# Patient Record
Sex: Female | Born: 1970 | Race: White | Hispanic: No | Marital: Married | Smoking: Never smoker
Health system: Southern US, Community
[De-identification: ages and names within clinical notes are randomized; demographics above are authoritative.]

## PROBLEM LIST (undated history)

## (undated) DIAGNOSIS — G5600 Carpal tunnel syndrome, unspecified upper limb: Secondary | ICD-10-CM

## (undated) DIAGNOSIS — T7840XA Allergy, unspecified, initial encounter: Secondary | ICD-10-CM

## (undated) DIAGNOSIS — I10 Essential (primary) hypertension: Secondary | ICD-10-CM

## (undated) DIAGNOSIS — E079 Disorder of thyroid, unspecified: Secondary | ICD-10-CM

## (undated) DIAGNOSIS — M542 Cervicalgia: Secondary | ICD-10-CM

## (undated) DIAGNOSIS — M1990 Unspecified osteoarthritis, unspecified site: Secondary | ICD-10-CM

## (undated) DIAGNOSIS — J45909 Unspecified asthma, uncomplicated: Secondary | ICD-10-CM

## (undated) HISTORY — DX: Disorder of thyroid, unspecified: E07.9

## (undated) HISTORY — DX: Carpal tunnel syndrome, unspecified upper limb: G56.00

## (undated) HISTORY — DX: Cervicalgia: M54.2

## (undated) HISTORY — DX: Allergy, unspecified, initial encounter: T78.40XA

## (undated) HISTORY — DX: Unspecified asthma, uncomplicated: J45.909

---

## 1999-02-10 ENCOUNTER — Other Ambulatory Visit: Admission: RE | Admit: 1999-02-10 | Discharge: 1999-02-10 | Payer: Self-pay | Admitting: Obstetrics and Gynecology

## 2000-02-26 ENCOUNTER — Other Ambulatory Visit: Admission: RE | Admit: 2000-02-26 | Discharge: 2000-02-26 | Payer: Self-pay | Admitting: Obstetrics and Gynecology

## 2001-02-25 ENCOUNTER — Other Ambulatory Visit: Admission: RE | Admit: 2001-02-25 | Discharge: 2001-02-25 | Payer: Self-pay | Admitting: *Deleted

## 2002-05-23 ENCOUNTER — Other Ambulatory Visit: Admission: RE | Admit: 2002-05-23 | Discharge: 2002-05-23 | Payer: Self-pay | Admitting: Obstetrics and Gynecology

## 2003-09-20 ENCOUNTER — Other Ambulatory Visit: Admission: RE | Admit: 2003-09-20 | Discharge: 2003-09-20 | Payer: Self-pay | Admitting: Obstetrics and Gynecology

## 2004-09-18 ENCOUNTER — Encounter: Admission: RE | Admit: 2004-09-18 | Discharge: 2004-09-18 | Payer: Self-pay | Admitting: Family Medicine

## 2006-02-22 ENCOUNTER — Encounter: Admission: RE | Admit: 2006-02-22 | Discharge: 2006-02-22 | Payer: Self-pay | Admitting: Obstetrics & Gynecology

## 2006-04-26 ENCOUNTER — Inpatient Hospital Stay (HOSPITAL_COMMUNITY): Admission: AD | Admit: 2006-04-26 | Discharge: 2006-04-30 | Payer: Self-pay | Admitting: Obstetrics & Gynecology

## 2006-04-27 ENCOUNTER — Encounter (INDEPENDENT_AMBULATORY_CARE_PROVIDER_SITE_OTHER): Payer: Self-pay | Admitting: *Deleted

## 2010-05-13 ENCOUNTER — Encounter: Admission: RE | Admit: 2010-05-13 | Discharge: 2010-05-13 | Payer: Self-pay | Admitting: Unknown Physician Specialty

## 2010-07-18 ENCOUNTER — Encounter: Admission: RE | Admit: 2010-07-18 | Discharge: 2010-07-18 | Payer: Self-pay | Admitting: Unknown Physician Specialty

## 2011-01-11 ENCOUNTER — Encounter: Payer: Self-pay | Admitting: Family Medicine

## 2011-02-13 ENCOUNTER — Other Ambulatory Visit: Payer: Self-pay | Admitting: Obstetrics & Gynecology

## 2011-03-18 ENCOUNTER — Other Ambulatory Visit: Payer: Self-pay | Admitting: Obstetrics & Gynecology

## 2011-05-08 NOTE — Op Note (Signed)
Renee Fernandez, Renee Fernandez           ACCOUNT NO.:  [id number]   MEDICAL RECORD NO.:  [id number]          PATIENT TYPE:  INP   LOCATION:  9165                          FACILITY:  [facility]   PHYSICIAN:  Genia Del, M.D.DATE OF BIRTH:  11/25/1971   DATE OF PROCEDURE:  DATE OF DISCHARGE:                                 OPERATIVE REPORT   OPERATIVE PROTOCOL:  40+ weeks gestation, failure to progress, nonreassuring  fetal heart rate monitoring.   POSTOPERATIVE DIAGNOSIS:  40+ weeks gestation, failure to progress,  nonreassuring fetal heart rate monitoring.   PROCEDURE:  Urgent low transverse primary C-section.   SURGEON:  Genia Del, M.D.   ASSISTANT:  Marlinda Mike, C.N.M.   ANESTHESIOLOGIST:  Raul Del, M.D.   PROCEDURE:  Under epidural anesthesia, the patient is in 15 degrees left  decubitus position.  She is prepped with Betadine in the abdominal,  suprapubic and vulvar areas.  The bladder catheter is already in place, and  the patient is draped as usual.  An infiltration of Marcaine, 0.25 plain, 10  cc is done at the level where the Pfannenstiel will be.  We then make a  Pfannenstiel incision with the scalpel.  We open the adipose tissue with the  scalpel and the electrocautery.  The aponeurosis is opened transversely with  the electrocautery and the Mayo scissors.  We then separate the aponeurosis  from the recti muscles superiorly and inferiorly.  We open the parietal  peritoneum bluntly with a finger and stretch.  We then put the bladder  retractor, open the visceral peritoneum transversely with the Metzenbaum  scissors over the lower uterine segment.  The bladder is withdrawn  downwards.  The bladder retractor is repositioned.  We make a low transverse  hysterotomy with the scalpel, extension on each side with dressing scissors.  Amniotic fluid is clear.  Fetus is in cephalic position.  A loose nuchal  cord is removed.  The baby is suctioned, after delivery of  the head.  We  then finish delivery.  We clamp the cord and cut it.  The baby is given to  the neonatal team.  Apgars are 8 and 9.  The pH is done and is has come back  7.27.  The cord blood is taken.  The placenta is delivered spontaneously.  We give the placenta for cord blood banking, and then the placenta will go  to pathology.  We do a uterine revision.  Pitocin IV is started.  Ancef 1  gram IV is given.  The uterus contracts well.  Both ovaries and both tubes  are normal to inspection.  We close the hysterotomy in a first locked  running suture of Vicryl 0, a second layer in a mattress fashion with Vicryl  0 is done, and an X-stitch for hemostasis is done in the midline.  We then  irrigate and suction the abdominopelvic cavity.  Hemostasis is completed on  the recti muscles and adipose tissue with the electrocautery.  The  aponeurosis is closed in two half-running suture of Vicryl 0.  We  reapproximate the skin with staples and a dry  dressing is applied.  The  count of instruments and sponges was complete times two.  The estimated  blood loss was 700 cc.  No complications occurred and the patient was  brought to recovery room in good status.  Note that no latex was used for  the procedure given the allergies.      Genia Del, M.D.  Electronically Signed     ML/MEDQ  D:  04/27/2006  T:  04/27/2006  Job:  [id number]

## 2011-05-08 NOTE — Discharge Summary (Signed)
Renee Fernandez, Renee Fernandez           ACCOUNT NO.:  [id number]   MEDICAL RECORD NO.:  [id number]          PATIENT TYPE:  INP   LOCATION:  9123                          FACILITY:  [facility]   PHYSICIAN:  Genia Del, M.D.DATE OF BIRTH:  Aug 19, 1971   DATE OF ADMISSION:  04/26/2006  DATE OF DISCHARGE:  04/30/2006                                 DISCHARGE SUMMARY   ADMISSION DIAGNOSIS:  Forty plus weeks, chronic hypertension, mild pregnancy  induced hypertension for induction.   DISCHARGE DIAGNOSES:  1.  Forty plus weeks, chronic hypertension, mild pregnancy induced      hypertension for induction.  2.  Failure to progress.  3.  Non-reassuring fetal heart rate monitoring.   INTERVENTION:  Low-transverse cesarean section, primary C-section on Apr 27, 2006.  Birth of a baby boy.   HOSPITAL COURSE:  The patient was induced for mild PIH.  She progressed to  3+ cm and did not progress further in spite of good uterine contractions.  Fetal heart rate monitoring showed repetitive moderate variable  decelerations.  The decision was therefore, taken to proceed with urgent  primary C-section.  The procedure was uneventful.  Estimated blood loss was  700 cc.  No complication occurred.   Her post-op evaluation was normal.  She remained hemodynamically stable and  afebrile.   Her post-op hemoglobin was 9.7, hematocrit 29.3 on Apr 28, 2006.  She was  discharged in stable status on Apr 30, 2006.  Her blood pressures were  stable on hydrochlorothiazide which was continued at home.   Post-op advice were given.  She will follow up in 4 weeks at [facility]  OB/GYN.      Genia Del, M.D.  Electronically Signed     ML/MEDQ  D:  05/28/2006  T:  05/28/2006  Job:  [id number]

## 2011-07-13 ENCOUNTER — Other Ambulatory Visit: Payer: Self-pay | Admitting: Family Medicine

## 2011-07-13 DIAGNOSIS — Z1231 Encounter for screening mammogram for malignant neoplasm of breast: Secondary | ICD-10-CM

## 2011-07-22 ENCOUNTER — Other Ambulatory Visit: Payer: Self-pay | Admitting: Family Medicine

## 2011-07-22 ENCOUNTER — Ambulatory Visit
Admission: RE | Admit: 2011-07-22 | Discharge: 2011-07-22 | Disposition: A | Discharge status: Home / Self Care | Payer: BC Managed Care – PPO | Source: Ambulatory Visit | Attending: Family Medicine | Admitting: Family Medicine

## 2011-07-22 DIAGNOSIS — Z1231 Encounter for screening mammogram for malignant neoplasm of breast: Secondary | ICD-10-CM

## 2012-08-03 ENCOUNTER — Other Ambulatory Visit: Payer: Self-pay | Admitting: Obstetrics & Gynecology

## 2012-08-03 DIAGNOSIS — Z1231 Encounter for screening mammogram for malignant neoplasm of breast: Secondary | ICD-10-CM

## 2012-08-24 ENCOUNTER — Ambulatory Visit
Admission: RE | Admit: 2012-08-24 | Discharge: 2012-08-24 | Disposition: A | Discharge status: Home / Self Care | Payer: BC Managed Care – PPO | Source: Ambulatory Visit | Attending: Obstetrics & Gynecology | Admitting: Obstetrics & Gynecology

## 2012-08-24 DIAGNOSIS — Z1231 Encounter for screening mammogram for malignant neoplasm of breast: Secondary | ICD-10-CM

## 2013-08-17 ENCOUNTER — Other Ambulatory Visit: Payer: Self-pay

## 2013-08-17 DIAGNOSIS — Z1231 Encounter for screening mammogram for malignant neoplasm of breast: Secondary | ICD-10-CM

## 2013-09-06 ENCOUNTER — Ambulatory Visit
Admission: RE | Admit: 2013-09-06 | Discharge: 2013-09-06 | Disposition: A | Discharge status: Home / Self Care | Payer: BC Managed Care – PPO | Source: Ambulatory Visit

## 2013-09-06 DIAGNOSIS — Z1231 Encounter for screening mammogram for malignant neoplasm of breast: Secondary | ICD-10-CM

## 2013-09-26 ENCOUNTER — Encounter: Payer: Self-pay | Admitting: Emergency Medicine

## 2013-09-26 ENCOUNTER — Emergency Department
Admission: EM | Admit: 2013-09-26 | Discharge: 2013-09-26 | Disposition: A | Discharge status: Home / Self Care | Payer: BC Managed Care – PPO | Source: Home / Self Care | Attending: Family Medicine | Admitting: Family Medicine

## 2013-09-26 DIAGNOSIS — J069 Acute upper respiratory infection, unspecified: Secondary | ICD-10-CM

## 2013-09-26 DIAGNOSIS — J45901 Unspecified asthma with (acute) exacerbation: Secondary | ICD-10-CM

## 2013-09-26 HISTORY — DX: Essential (primary) hypertension: I10

## 2013-09-26 HISTORY — DX: Unspecified osteoarthritis, unspecified site: M19.90

## 2013-09-26 MED ORDER — BENZONATATE 200 MG PO CAPS: ORAL_CAPSULE | ORAL | Status: DC

## 2013-09-26 MED ORDER — PREDNISONE 20 MG PO TABS: 20.0000 mg | ORAL_TABLET | Freq: Two times a day (BID) | ORAL | Status: DC

## 2013-09-26 MED ORDER — METHYLPREDNISOLONE ACETATE 80 MG/ML IJ SUSP
80.0000 mg | Freq: Once | INTRAMUSCULAR | Status: AC
  Administered 2013-09-26: 80 mg via INTRAMUSCULAR

## 2013-09-26 MED ORDER — ALBUTEROL SULFATE HFA 108 (90 BASE) MCG/ACT IN AERS: 2.0000 | INHALATION_SPRAY | RESPIRATORY_TRACT | Status: DC | PRN

## 2013-09-26 MED ORDER — AMOXICILLIN 875 MG PO TABS: 875.0000 mg | ORAL_TABLET | Freq: Two times a day (BID) | ORAL | Status: DC

## 2013-09-26 NOTE — ED Provider Notes (Signed)
CSN: [id number]     Arrival date & time 09/26/13  1702 History   None    Chief Complaint  Patient presents with  . Cough     HPI Comments: Patient complains of two day history of URI symptoms with productive cough, increased sinus congestion, low grade fever, tightness in anterior chest, occasional wheezing/shortness of breath, and myalgias.  She has also had some loose stools. She has a history of asthma. She has had pneumonia in the past.  The history is provided by the patient.    Past Medical History  Diagnosis Date  . Hypertension   . Arthritis    Past Surgical History  Procedure Laterality Date  . Cesarean section     Family History  Problem Relation Age of Onset  . Multiple sclerosis Mother   . Hypertension Father    History  Substance Use Topics  . Smoking status: Never Smoker   . Smokeless tobacco: Not on file  . Alcohol Use: Yes   OB History   Grav Para Term Preterm Abortions TAB SAB Ect Mult Living                 Review of Systems No sore throat + cough No pleuritic pain + wheezing + nasal congestion + post-nasal drainage No sinus pain/pressure No itchy/red eyes ? earache No hemoptysis + SOB + low grade fever, + chills + nausea No vomiting No abdominal pain No diarrhea No urinary symptoms No skin rashes + fatigue + myalgias + headache Used OTC meds without relief   Allergies  Review of patient's allergies indicates no known allergies.  Home Medications   Current Outpatient Rx  Name  Route  Sig  Dispense  Refill  . benazepril-hydrochlorthiazide (LOTENSIN HCT) 10-12.5 MG per tablet   Oral   Take 1 tablet by mouth daily.         . cetirizine (ZYRTEC) 10 MG tablet   Oral   Take 10 mg by mouth daily.         Marland Kitchen levothyroxine (SYNTHROID, LEVOTHROID) 50 MCG tablet   Oral   Take 50 mcg by mouth daily before breakfast.         . pindolol (VISKEN) 10 MG tablet   Oral   Take 10 mg by mouth 2 (two) times daily.         Marland Kitchen  albuterol (PROVENTIL HFA;VENTOLIN HFA) 108 (90 BASE) MCG/ACT inhaler   Inhalation   Inhale 2 puffs into the lungs every 4 (four) hours as needed for wheezing.   1 Inhaler   0   . amoxicillin (AMOXIL) 875 MG tablet   Oral   Take 1 tablet (875 mg total) by mouth 2 (two) times daily. (Rx void after 10/04/13)   20 tablet   0   . benzonatate (TESSALON) 200 MG capsule      Take one cap at bedtime as necessary for cough   12 capsule   0   . predniSONE (DELTASONE) 20 MG tablet   Oral   Take 1 tablet (20 mg total) by mouth 2 (two) times daily.   10 tablet   0    BP 137/70  Pulse 103  Temp(Src) 99.8 F (37.7 C) (Oral)  Ht 5' (1.524 m)  Wt 220 lb (99.791 kg)  BMI 42.97 kg/m2  SpO2 94% Physical Exam Nursing notes and Vital Signs reviewed. Appearance:  Patient appears stated age, and in no acute distress.  Patient is obese (BMI 43.0) Eyes:  Pupils  are equal, round, and reactive to light and accomodation.  Extraocular movement is intact.  Conjunctivae are not inflamed  Ears:  Canals normal.  Tympanic membranes normal.  Nose:  Mildly congested turbinates.  No sinus tenderness.  Pharynx:  Normal Neck:  Supple.  Slightly tender shotty posterior nodes are palpated bilaterally  Lungs:  Clear to auscultation.  Breath sounds are equal.  Heart:  Regular rate and rhythm without murmurs, rubs, or gallops.  Abdomen:  Nontender without masses or hepatosplenomegaly.  Bowel sounds are present.  No CVA or flank tenderness.  Extremities:  No edema.  No calf tenderness Skin:  No rash present.    ED Course  Procedures  none        MDM   1. Acute upper respiratory infections of unspecified site; suspect viral URI   2. Asthma with acute exacerbation    There is no evidence of bacterial infection today.   DepoMedrol 80mg  IM; tomorrow begin prednisone burst. Prescription written for Benzonatate (Tessalon) to take at bedtime for night-time cough.  Refill albuterol inhaler. Take plain Mucinex  (guaifenesin) twice daily for cough and congestion.  May add Sudafed for sinus congestion.  Increase fluid intake, rest. May use Afrin nasal spray (or generic oxymetazoline) twice daily for about 5 days.  Also recommend using saline nasal spray several times daily and saline nasal irrigation (AYR is a common brand) Stop all antihistamines for now, and other non-prescription cough/cold preparations. Begin Amoxicillin if not improving about 5 days or if persistent fever develops (Given a prescription to hold, with an expiration date)  Follow-up with family doctor if not improving 7 to 10 days.     Lattie Haw, MD 09/27/13 1106

## 2013-09-26 NOTE — ED Notes (Signed)
Productive cough, fever, nausea, diarrhea, body aches x 2 days

## 2013-09-26 NOTE — Discharge Instructions (Signed)
Take plain Mucinex (guaifenesin) twice daily for cough and congestion.  May add Sudafed for sinus congestion.  Increase fluid intake, rest. May use Afrin nasal spray (or generic oxymetazoline) twice daily for about 5 days.  Also recommend using saline nasal spray several times daily and saline nasal irrigation (AYR is a common brand) Stop all antihistamines for now, and other non-prescription cough/cold preparations. Begin Amoxicillin if not improving about 5 days or if persistent fever develops. Follow-up with family doctor if not improving 7 to 10 days.

## 2014-03-16 ENCOUNTER — Ambulatory Visit (INDEPENDENT_AMBULATORY_CARE_PROVIDER_SITE_OTHER): Payer: BC Managed Care – PPO | Admitting: Family Medicine

## 2014-03-16 VITALS — BP 120/80 | HR 73 | Temp 98.9°F | Resp 16 | Ht 60.5 in | Wt 217.0 lb

## 2014-03-16 DIAGNOSIS — I10 Essential (primary) hypertension: Secondary | ICD-10-CM

## 2014-03-16 DIAGNOSIS — Z9109 Other allergy status, other than to drugs and biological substances: Secondary | ICD-10-CM

## 2014-03-16 DIAGNOSIS — E039 Hypothyroidism, unspecified: Secondary | ICD-10-CM

## 2014-03-16 DIAGNOSIS — J45909 Unspecified asthma, uncomplicated: Secondary | ICD-10-CM

## 2014-03-16 DIAGNOSIS — Z0000 Encounter for general adult medical examination without abnormal findings: Secondary | ICD-10-CM

## 2014-03-16 MED ORDER — LEVOTHYROXINE SODIUM 50 MCG PO TABS: 50.0000 ug | ORAL_TABLET | Freq: Every day | ORAL | Status: DC

## 2014-03-16 MED ORDER — ALBUTEROL SULFATE HFA 108 (90 BASE) MCG/ACT IN AERS: 2.0000 | INHALATION_SPRAY | RESPIRATORY_TRACT | Status: DC | PRN

## 2014-03-16 MED ORDER — PINDOLOL 10 MG PO TABS: 10.0000 mg | ORAL_TABLET | Freq: Two times a day (BID) | ORAL | Status: DC

## 2014-03-16 MED ORDER — BENAZEPRIL-HYDROCHLOROTHIAZIDE 10-12.5 MG PO TABS: 1.0000 | ORAL_TABLET | Freq: Every day | ORAL | Status: DC

## 2014-03-16 MED ORDER — CETIRIZINE HCL 10 MG PO TABS: 10.0000 mg | ORAL_TABLET | Freq: Every day | ORAL | Status: AC

## 2014-03-16 NOTE — Progress Notes (Signed)
Subjective:    Patient ID: Renee Fernandez, female    DOB: 1971-12-08, 43 y.o.   MRN: [id number]  HPI   Review of Systems     Objective:   Physical Exam   Patient ID: Renee Fernandez MRN: [id number], DOB: [birth year], 43 y.o. Date of Encounter: 03/16/2014, 8:59 AM  Primary Physician: Elvina Sidle, MD  Chief Complaint: Physical (CPE)  HPI: 43 y.o. y/o female with history of noted below here for CPE.   Scribed for Elvina Sidle MD, the patient was seen in room 13. This chart was scribed by Lewanda Rife, ED scribe. Patient's care was started at 8:53 AM  HPI Comments: Hx was provided by the pt.  Renee Fernandez is a 43 y.o. female who presents to the [facility] for a complete physical. Reports she needs refills Zyrtec, synthroid, HCTZ, and albuterol. States she does not exercise regularly. Reports PMHx of seasonal allergies. Denies smoking cigarettes.  Reports PMHx uterine ablation, hypothyroidism, HTN, asthma, and seasonal allergies.   States she works at [employer]. Pt describes an unusual experience with [facility] employee health phlebotomist who extracted blood using gravity and did not use a Vacutainer. States she brought in blood results from [facility].   LMP: 02/2011 uterine ablation  Pap: MMG: Review of Systems: Consitutional: No fever, chills, fatigue, night sweats, lymphadenopathy, or weight changes. Eyes: No visual changes, eye redness, or discharge. ENT/Mouth: Ears: No otalgia, tinnitus, hearing loss, discharge. Nose: No congestion, rhinorrhea, sinus pain, or epistaxis. Throat: No sore throat, post nasal drip, or teeth pain. Cardiovascular: No CP, palpitations, diaphoresis, DOE, edema, orthopnea, PND. Respiratory: No cough, hemoptysis, SOB, or wheezing. Gastrointestinal: No anorexia, dysphagia, reflux, pain, nausea, vomiting, hematemesis, diarrhea, constipation, BRBPR, or melena. Breast: No discharge, pain, swelling, or  mass. Genitourinary: No dysuria, frequency, urgency, hematuria, incontinence, nocturia, amenorrhea, vaginal discharge, pruritis, burning, abnormal bleeding, or pain. Musculoskeletal: No decreased ROM, myalgias, stiffness, joint swelling, or weakness. Skin: No rash, erythema, lesion changes, pain, warmth, jaundice, or pruritis. Neurological: No headache, dizziness, syncope, seizures, tremors, memory loss, coordination problems, or paresthesias. Psychological: No anxiety, depression, hallucinations, SI/HI. Endocrine: No fatigue, polydipsia, polyphagia, polyuria, or known diabetes. All other systems were reviewed and are otherwise negative.  Past Medical History  Diagnosis Date   Hypertension    Arthritis      Past Surgical History  Procedure Laterality Date   Cesarean section      Home Meds:  Prior to Admission medications   Medication Sig Start Date End Date Taking? Authorizing Provider  albuterol (PROVENTIL HFA;VENTOLIN HFA) 108 (90 BASE) MCG/ACT inhaler Inhale 2 puffs into the lungs every 4 (four) hours as needed for wheezing. 09/26/13  Yes Lattie Haw, MD  benazepril-hydrochlorthiazide (LOTENSIN HCT) 10-12.5 MG per tablet Take 1 tablet by mouth daily.   Yes Historical Provider, MD  cetirizine (ZYRTEC) 10 MG tablet Take 10 mg by mouth daily.   Yes Historical Provider, MD  levothyroxine (SYNTHROID, LEVOTHROID) 50 MCG tablet Take 50 mcg by mouth daily before breakfast.   Yes Historical Provider, MD  pindolol (VISKEN) 10 MG tablet Take 10 mg by mouth 2 (two) times daily.   Yes Historical Provider, MD    Allergies: No Known Allergies  History   Social History   Marital Status: Married    Spouse Name: N/A    Number of Children: N/A   Years of Education: N/A   Occupational History   Not on file.   Social History Main Topics  Smoking status: Never Smoker    Smokeless tobacco: Not on file   Alcohol Use: Yes   Drug Use: Not on file   Sexual Activity: Not on file    Other Topics Concern   Not on file   Social History Narrative   No narrative on file    Family History  Problem Relation Age of Onset   Multiple sclerosis Mother    Hypertension Father     Physical Exam:* Blood pressure 120/80, pulse 73, temperature 98.9 F (37.2 C), temperature source Oral, resp. rate 16, height 5' 0.5" (1.537 m), weight 217 lb (98.431 kg), SpO2 98.00%., Body mass index is 41.67 kg/(m^2). General: Well developed, well nourished, in no acute distress. HEENT: Normocephalic, atraumatic. Conjunctiva pink, sclera non-icteric. Pupils 2 mm constricting to 1 mm, round, regular, and equally reactive to light and accomodation. EOMI. Internal auditory canal clear. TMs with good cone of light and without pathology. Nasal mucosa pink. Nares are without discharge. No sinus tenderness. Oral mucosa pink. Dentition normal. Pharynx without exudate.   Neck: Supple. Trachea midline. No thyromegaly. Full ROM. No lymphadenopathy. Lungs: Clear to auscultation bilaterally without wheezes, rales, or rhonchi. Breathing is of normal effort and unlabored. Cardiovascular: RRR with S1 S2. No murmurs, rubs, or gallops appreciated. Distal pulses 2+ symmetrically. No carotid or abdominal bruits. Abdomen: Soft, non-tender, non-distended with normoactive bowel sounds. No hepatosplenomegaly or masses. No rebound/guarding. No CVA tenderness. Without hernias.  Musculoskeletal: Full range of motion and 5/5 strength throughout. Without swelling, atrophy, tenderness, crepitus, or warmth. Extremities without clubbing, cyanosis, or edema. Calves supple. Skin: Warm and moist without erythema, ecchymosis, wounds, or rash. Neuro: A+Ox3. CN II-XII grossly intact. Moves all extremities spontaneously. Full sensation throughout. Normal gait. DTR 2+ throughout upper and lower extremities. Finger to nose intact. Psych:  Responds to questions appropriately with a normal affect.   Studies: CBC, CMET, Lipid, TSH,  resulted. Patient is  UA:   Assessment/Plan:  43 y.o. y/o female here for CPE -Hypertension - Plan: pindolol (VISKEN) 10 MG tablet, benazepril-hydrochlorthiazide (LOTENSIN HCT) 10-12.5 MG per tablet  Hypothyroidism - Plan: levothyroxine (SYNTHROID, LEVOTHROID) 50 MCG tablet  Environmental allergies - Plan: cetirizine (ZYRTEC) 10 MG tablet  Asthma - Plan: albuterol (PROVENTIL HFA;VENTOLIN HFA) 108 (90 BASE) MCG/ACT inhaler  Routine general medical examination at a health care facility    Signed, Elvina SidleKurt Lauenstein, MD 03/16/2014 8:59 AM         Assessment & Plan:

## 2014-03-16 NOTE — Patient Instructions (Signed)

## 2014-03-19 ENCOUNTER — Telehealth: Payer: Self-pay

## 2014-03-19 NOTE — Telephone Encounter (Signed)
Lm for rtn call 

## 2014-03-19 NOTE — Telephone Encounter (Signed)
Pt requesting prednisone pak for asthma  Best phone [phone]  Pharmacy [facility]

## 2014-03-20 NOTE — Telephone Encounter (Signed)
Dr. Elbert EwingsL do you want to send in a dose pack.

## 2014-03-20 NOTE — Telephone Encounter (Signed)
Pt has asthma and has developed a cough and has been dealing with allergies all weekend regardless of her inhaler and Zyrtec. Pt states the only thing that works is a prednisone dose pak. Please advise if we can call in this for her.

## 2014-07-31 ENCOUNTER — Other Ambulatory Visit: Payer: Self-pay

## 2014-07-31 DIAGNOSIS — Z1231 Encounter for screening mammogram for malignant neoplasm of breast: Secondary | ICD-10-CM

## 2014-09-07 ENCOUNTER — Ambulatory Visit
Admission: RE | Admit: 2014-09-07 | Discharge: 2014-09-07 | Disposition: A | Discharge status: Home / Self Care | Payer: BC Managed Care – PPO | Source: Ambulatory Visit

## 2014-09-07 DIAGNOSIS — Z1231 Encounter for screening mammogram for malignant neoplasm of breast: Secondary | ICD-10-CM

## 2015-03-25 ENCOUNTER — Other Ambulatory Visit: Payer: Self-pay | Admitting: Family Medicine

## 2015-05-03 ENCOUNTER — Other Ambulatory Visit: Payer: Self-pay | Admitting: Family Medicine

## 2015-06-10 ENCOUNTER — Ambulatory Visit (INDEPENDENT_AMBULATORY_CARE_PROVIDER_SITE_OTHER): Payer: BLUE CROSS/BLUE SHIELD | Admitting: Family Medicine

## 2015-06-10 VITALS — BP 131/94 | HR 90 | Temp 99.4°F | Resp 18 | Ht 60.25 in | Wt 217.0 lb

## 2015-06-10 DIAGNOSIS — Z0000 Encounter for general adult medical examination without abnormal findings: Secondary | ICD-10-CM | POA: Diagnosis not present

## 2015-06-10 DIAGNOSIS — I10 Essential (primary) hypertension: Secondary | ICD-10-CM | POA: Diagnosis not present

## 2015-06-10 DIAGNOSIS — J4520 Mild intermittent asthma, uncomplicated: Secondary | ICD-10-CM | POA: Diagnosis not present

## 2015-06-10 DIAGNOSIS — G252 Other specified forms of tremor: Secondary | ICD-10-CM | POA: Diagnosis not present

## 2015-06-10 DIAGNOSIS — E038 Other specified hypothyroidism: Secondary | ICD-10-CM

## 2015-06-10 MED ORDER — PINDOLOL 10 MG PO TABS: ORAL_TABLET | ORAL | Status: DC

## 2015-06-10 MED ORDER — ALBUTEROL SULFATE HFA 108 (90 BASE) MCG/ACT IN AERS: 2.0000 | INHALATION_SPRAY | RESPIRATORY_TRACT | Status: DC | PRN

## 2015-06-10 MED ORDER — LEVOTHYROXINE SODIUM 50 MCG PO TABS: ORAL_TABLET | ORAL | Status: DC

## 2015-06-10 MED ORDER — BENAZEPRIL-HYDROCHLOROTHIAZIDE 10-12.5 MG PO TABS: ORAL_TABLET | ORAL | Status: DC

## 2015-06-10 NOTE — Patient Instructions (Signed)

## 2015-06-10 NOTE — Progress Notes (Signed)
Patient ID: Renee Fernandez, female   DOB: 15-Jan-1971, 44 y.o.   MRN: [id number]   This chart was scribed for Renee Sidle, MD by [facility], medical scribe at [facility].The patient was seen in exam room 13 and the patient's care was started at 8:27 PM.  Patient ID: Renee Fernandez MRN: [id number], DOB: November 17, 1971, 44 y.o. Date of Encounter: 06/10/2015  Primary Physician: Renee Sidle, MD  Chief Complaint:  Chief Complaint  Patient presents with   Annual Exam   HPI:  Renee Fernandez is a 44 y.o. female who presents to [facility] for an annual physical exam for work. Pt has noticed very fine action tremors. 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  Hypertension: Blood pressure is elevated but she has ran out of benazepril-HCTZ about three days ago. Recheck is 140/90. She has started back up in the gym, she does drink soda.  Hypothyroidism: She is taking levothyroxine.   Allergies: She takes zyrtec and allegra.  Health maintenance; She is up to date on mammogram, PAP smear, dentist visits, eye visits. She is unsure when her last tetanus shot is but she does get them at work. No family history of colon cancer.  Past Medical History  Diagnosis Date   Hypertension    Arthritis    Allergy    Asthma    Thyroid disease     Home Meds: Prior to Admission medications   Medication Sig Start Date End Date Taking? Authorizing Provider  albuterol (PROVENTIL HFA;VENTOLIN HFA) 108 (90 BASE) MCG/ACT inhaler Inhale 2 puffs into the lungs every 4 (four) hours as needed for wheezing. 03/16/14  Yes Renee Sidle, MD  benazepril-hydrochlorthiazide (LOTENSIN HCT) 10-12.5 MG per tablet TAKE 1 TABLET BY MOUTH DAILY. PATIENT NEEDS OFFICE VISIT FOR ADDITIONAL REFILLS 05/04/15  Yes Renee Sidle, MD  cetirizine (ZYRTEC) 10 MG tablet Take 1 tablet (10 mg total) by mouth daily. 03/16/14  Yes Renee Sidle, MD  levothyroxine (SYNTHROID, LEVOTHROID) 50 MCG tablet TAKE 1 TABLET BY MOUTH EVERY MORNING BEFORE BREAKFAST. NEED OFFICE VISIT/LABS 05/04/15  Yes Renee Sidle, MD  pindolol (VISKEN) 10 MG tablet TAKE 1 TABLET BY MOUTH TWICE A DAY NEED OFFICE VISIT 05/04/15  Yes Renee Sidle, MD   Allergies: No Known Allergies  History   Social History   Marital Status: Married    Spouse Name: N/A   Number of Children: N/A   Years of Education: N/A   Occupational History   Not on file.   Social History Main Topics   Smoking status: Never Smoker    Smokeless tobacco: Not on file   Alcohol Use: Yes   Drug Use: No   Sexual Activity: Yes   Other Topics Concern   Not on file   Social History Narrative    Review of Systems: Constitutional: negative for chills, fever, night sweats, weight changes, or fatigue  HEENT: negative for vision changes, hearing loss, congestion, rhinorrhea, ST, epistaxis, or sinus pressure Cardiovascular: negative for chest pain or palpitations Respiratory: negative for hemoptysis, wheezing, shortness of breath, or cough Abdominal: negative for abdominal pain, nausea, vomiting, diarrhea, or constipation Dermatological: negative for rash Neurologic: negative for headache, dizziness, or syncope All other systems reviewed and are otherwise negative with the exception to those above and in the HPI.  Physical Exam: Blood pressure 131/94, pulse 90, temperature 99.4 F (37.4 C), temperature source Oral, resp. rate 18, height  5' 0.25" (1.53 m), weight 217 lb (98.431 kg), SpO2 98 %., Body mass index is 42.05 kg/(m^2).   Visual Acuity Screening   Right eye Left eye Both eyes  Without correction: 20/25-1 20/50 20/25  With correction:     Comments: The patient can distinguish the colors red, amber and green. The patient was able to hear a forced whisper from 10 feet. General: Well developed, well nourished, in no acute distress. Head: Normocephalic,  atraumatic, eyes without discharge, sclera non-icteric, nares are without discharge. Bilateral auditory canals clear, TM's are without perforation, pearly grey and translucent with reflective cone of light bilaterally. Oral cavity moist, posterior pharynx without exudate, erythema, peritonsillar abscess, or post nasal drip.  Neck: Supple. No thyromegaly. Full ROM. No lymphadenopathy. Lungs: Clear bilaterally to auscultation without wheezes, rales, or rhonchi. Breathing is unlabored. Heart: Very faint murmur best heard at the left sternal border and S4 gallop. Abdomen: Soft, non-tender, non-distended with normoactive bowel sounds. No hepatomegaly. No rebound/guarding. No obvious abdominal masses. Msk:  Strength and tone normal for age. Extremities/Skin: Warm and dry. No clubbing or cyanosis. No edema. No rashes or suspicious lesions. Neuro: Alert and oriented X 3. Moves all extremities spontaneously. Gait is normal. CNII-XII grossly in tact. Fine action tremor of her hands. Psych:  Responds to questions appropriately with a normal affect.   Labs: All labs are normal except: 1. Uric Acid, Serum- 7.4 2. Cholesterol, Total- 213 3 LDL Cholesterol Calc- 137 4. Thyroxine (T4)- 12.3  ASSESSMENT AND PLAN:  44 y.o. year old female with  This chart was scribed in my presence and reviewed by me personally.    ICD-9-CM ICD-10-CM   1. Asthma, mild intermittent, uncomplicated 493.90 J45.20 albuterol (PROVENTIL HFA;VENTOLIN HFA) 108 (90 BASE) MCG/ACT inhaler  2. Essential hypertension 401.9 I10 benazepril-hydrochlorthiazide (LOTENSIN HCT) 10-12.5 MG per tablet     pindolol (VISKEN) 10 MG tablet  3. 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 Other specified hypothyroidism 244.8 E03.8 levothyroxine (SYNTHROID, LEVOTHROID) 50 MCG tablet  6. Morbid obesity 278.01 E66.01     Reviewed the need for OB/GYN visit (patient declined breast exam and pelvic) sees as well as occult stool  check, weight loss, regular exercise, regular dental checkups, regular eye visits. Patient was encouraged back on her regular dose of blood pressure medicine. Forms were filled out.   Signed, Renee Sidle, MD 06/10/2015 8:20 PM

## 2015-08-28 ENCOUNTER — Other Ambulatory Visit: Payer: Self-pay

## 2015-08-28 DIAGNOSIS — I10 Essential (primary) hypertension: Secondary | ICD-10-CM

## 2015-08-28 MED ORDER — PINDOLOL 10 MG PO TABS: ORAL_TABLET | ORAL | Status: DC

## 2015-09-23 ENCOUNTER — Other Ambulatory Visit: Payer: Self-pay

## 2015-09-23 DIAGNOSIS — Z1231 Encounter for screening mammogram for malignant neoplasm of breast: Secondary | ICD-10-CM

## 2015-10-11 ENCOUNTER — Ambulatory Visit: Payer: Self-pay

## 2015-10-18 ENCOUNTER — Ambulatory Visit
Admission: RE | Admit: 2015-10-18 | Discharge: 2015-10-18 | Disposition: A | Discharge status: Home / Self Care | Payer: BLUE CROSS/BLUE SHIELD | Source: Ambulatory Visit

## 2015-10-18 DIAGNOSIS — Z1231 Encounter for screening mammogram for malignant neoplasm of breast: Secondary | ICD-10-CM

## 2016-06-28 ENCOUNTER — Other Ambulatory Visit: Payer: Self-pay | Admitting: Family Medicine

## 2016-07-07 LAB — LIPID PANEL
Cholesterol: 213 mg/dL — AB (ref 0–200)
HDL: 64 mg/dL (ref 35–70)
LDL Cholesterol: 132 mg/dL
TRIGLYCERIDES: 83 mg/dL (ref 40–160)

## 2016-07-07 LAB — BASIC METABOLIC PANEL
BUN: 16 mg/dL (ref 4–21)
CREATININE: 1 mg/dL (ref 0.5–1.1)
Glucose: 92 mg/dL
Potassium: 4.6 mmol/L (ref 3.4–5.3)
SODIUM: 139 mmol/L (ref 137–147)

## 2016-07-07 LAB — TSH: TSH: 3.11 u[IU]/mL (ref 0.41–5.90)

## 2016-07-07 LAB — CBC AND DIFFERENTIAL
HCT: 39 % (ref 36–46)
Hemoglobin: 13.2 g/dL (ref 12.0–16.0)
Platelets: 311 10*3/uL (ref 150–399)
WBC: 10.8 10^3/mL

## 2016-07-07 LAB — HEPATIC FUNCTION PANEL
ALK PHOS: 87 U/L (ref 25–125)
ALT: 15 U/L (ref 7–35)
AST: 14 U/L (ref 13–35)
BILIRUBIN, TOTAL: 0.4 mg/dL

## 2016-08-04 ENCOUNTER — Other Ambulatory Visit: Payer: Self-pay | Admitting: Urgent Care

## 2016-08-13 ENCOUNTER — Encounter: Payer: Self-pay | Admitting: Physician Assistant

## 2016-08-13 ENCOUNTER — Ambulatory Visit (INDEPENDENT_AMBULATORY_CARE_PROVIDER_SITE_OTHER): Payer: BLUE CROSS/BLUE SHIELD | Admitting: Physician Assistant

## 2016-08-13 VITALS — BP 114/72 | HR 91 | Temp 98.2°F | Resp 16 | Ht 60.5 in | Wt 220.0 lb

## 2016-08-13 DIAGNOSIS — Z9109 Other allergy status, other than to drugs and biological substances: Secondary | ICD-10-CM

## 2016-08-13 DIAGNOSIS — Z0000 Encounter for general adult medical examination without abnormal findings: Secondary | ICD-10-CM

## 2016-08-13 DIAGNOSIS — J4520 Mild intermittent asthma, uncomplicated: Secondary | ICD-10-CM

## 2016-08-13 DIAGNOSIS — Z91048 Other nonmedicinal substance allergy status: Secondary | ICD-10-CM | POA: Diagnosis not present

## 2016-08-13 DIAGNOSIS — E039 Hypothyroidism, unspecified: Secondary | ICD-10-CM

## 2016-08-13 DIAGNOSIS — R431 Parosmia: Secondary | ICD-10-CM | POA: Diagnosis not present

## 2016-08-13 DIAGNOSIS — R432 Parageusia: Secondary | ICD-10-CM

## 2016-08-13 DIAGNOSIS — R439 Unspecified disturbances of smell and taste: Secondary | ICD-10-CM

## 2016-08-13 DIAGNOSIS — I10 Essential (primary) hypertension: Secondary | ICD-10-CM | POA: Diagnosis not present

## 2016-08-13 DIAGNOSIS — Z23 Encounter for immunization: Secondary | ICD-10-CM | POA: Diagnosis not present

## 2016-08-13 MED ORDER — ALBUTEROL SULFATE HFA 108 (90 BASE) MCG/ACT IN AERS: 2.0000 | INHALATION_SPRAY | RESPIRATORY_TRACT | 2 refills | Status: DC | PRN

## 2016-08-13 MED ORDER — PINDOLOL 10 MG PO TABS: ORAL_TABLET | ORAL | 4 refills | Status: DC

## 2016-08-13 MED ORDER — LEVOTHYROXINE SODIUM 50 MCG PO TABS: ORAL_TABLET | ORAL | 4 refills | Status: DC

## 2016-08-13 MED ORDER — BENAZEPRIL-HYDROCHLOROTHIAZIDE 10-12.5 MG PO TABS: 1.0000 | ORAL_TABLET | Freq: Every day | ORAL | 4 refills | Status: DC

## 2016-08-13 NOTE — Patient Instructions (Addendum)
   IF you received an x-ray today, you will receive an invoice from [facility]. Please contact [facility] at [phone] with questions or concerns regarding your invoice.   IF you received labwork today, you will receive an invoice from [facility]. Please contact [facility] at [phone] with questions or concerns regarding your invoice.   Our billing staff will not be able to assist you with questions regarding bills from these companies.  You will be contacted with the lab results as soon as they are available. The fastest way to get your results is to activate your My Chart account. Instructions are located on the last page of this paperwork. If you have not heard from us regarding the results in 2 weeks, please contact this office.     We recommend that you schedule a mammogram for breast cancer screening. Typically, you do not need a referral to do this. Please contact a local imaging center to schedule your mammogram.  [facility] - [phone]  *ask for the Radiology Department [facility] - [phone] or [phone]  [facility] - [phone] [facility] - [phone] [facility] - [phone]  *ask for the Radiology Department [facility] - [phone]  *ask for the Radiology Department [facility] - [phone]  *ask for the Mammography Department [facility] - [phone]   Keeping You Healthy  Get These Tests 1. Blood Pressure- Have your blood pressure checked once a year by your health care provider.  Normal blood pressure is 120/80. 2. Weight- Have your body mass index (BMI) calculated to screen for obesity.  BMI is measure of body fat based on height and weight.  You can also calculate your own BMI at www.nhlbisupport.com/bmi/. 3. Cholesterol- Have your cholesterol checked every 5 years starting at  age 20 then yearly starting at age 45. 4. Chlamydia, HIV, and other sexually transmitted diseases- Get screened every year until age 25, then within three months of each new sexual provider. 5. Pap Test - Every 1-5 years; discuss with your health care provider. 6. Mammogram- Every 1-2 years starting at age 40--50  Take these medicines  Calcium with Vitamin D-Your body needs 1200 mg of Calcium each day and 800-1000 IU of Vitamin D daily.  Your body can only absorb 500 mg of Calcium at a time so Calcium must be taken in 2 or 3 divided doses throughout the day.  Multivitamin with folic acid- Once daily if it is possible for you to become pregnant.  Get these Immunizations  Gardasil-Series of three doses; prevents HPV related illness such as genital warts and cervical cancer.  Menactra-Single dose; prevents meningitis.  Tetanus shot- Every 10 years.  Flu shot-Every year.  Take these steps 1. Do not smoke-Your healthcare provider can help you quit.  For tips on how to quit go to www.smokefree.gov or call 1-800 QUITNOW. 2. Be physically active- Exercise 5 days a week for at least 30 minutes.  If you are not already physically active, start slow and gradually work up to 30 minutes of moderate physical activity.  Examples of moderate activity include walking briskly, dancing, swimming, bicycling, etc. 3. Breast Cancer- A self breast exam every month is important for early detection of breast cancer.  For more information and instruction on self breast exams, ask your healthcare provider or www.womenshealth.gov/faq/breast-self-exam.cfm. 4. Eat a healthy diet- Eat a variety of healthy foods such   as fruits, vegetables, whole grains, low fat milk, low fat cheeses, yogurt, lean meats, poultry and fish, beans, nuts, tofu, etc.  For more information go to www. Thenutritionsource.org 5. Drink alcohol in moderation- Limit alcohol intake to one drink or less per day. Never drink and drive. 6. Depression-  Your emotional health is as important as your physical health.  If you're feeling down or losing interest in things you normally enjoy please talk to your healthcare provider about being screened for depression. 7. Dental visit- Brush and floss your teeth twice daily; visit your dentist twice a year. 8. Eye doctor- Get an eye exam at least every 2 years. 9. Helmet use- Always wear a helmet when riding a bicycle, motorcycle, rollerblading or skateboarding. 10. Safe sex- If you may be exposed to sexually transmitted infections, use a condom. 11. Seat belts- Seat belts can save your live; always wear one. 12. Smoke/Carbon Monoxide detectors- These detectors need to be installed on the appropriate level of your home. Replace batteries at least once a year. 13. Skin cancer- When out in the sun please cover up and use sunscreen 15 SPF or higher. 14. Violence- If anyone is threatening or hurting you, please tell your healthcare provider.         

## 2016-08-13 NOTE — Progress Notes (Signed)
Patient ID: Renee Fernandez, female    DOB: 09/25/1971, 45 y.o.   MRN: [id number]  PCP: Elvina SidleKurt Lauenstein, MD  Chief Complaint  Patient presents with  . Annual Exam  . Other    "smelling something burning" x3-4weeks   . Medication Refill    Albuterol     Subjective:   HPI: Presents for [name].  Cervical Cancer Screening: knows she is due for this. Sees GYN. Plans to make an appointment. Breast Cancer Screening: no recent mammogram. Inconsistent SBE and CBE. Colorectal Cancer Screening: not yet. Bone Density Testing: not yet. HIV Screening: Not yet. declines. STI Screening: very low risk Seasonal Influenza Vaccination: not yet Td/Tdap Vaccination: due. Declines. Pneumococcal Vaccination: not yet. Zoster Vaccination: not yet. Frequency of Dental evaluation: Q6 months until 2 years ago Frequency of Eye evaluation: annually  Odd odor. Heightened sense of smell. Chocolate, peanuts, grilled foods "totally stink to me." Gain detergent, soap, etc. Stink. No change in allergy control. Also change in taste. Her sister has MS, and she is concerned that this may the her own presenting symptom.    Patient Active Problem List   Diagnosis Date Noted  . Hypothyroidism 08/13/2016  . Benign essential HTN 08/13/2016  . Environmental allergies 08/13/2016    Past Medical History:  Diagnosis Date  . Allergy   . Arthritis   . Asthma   . Hypertension   . Thyroid disease      Prior to Admission medications   Medication Sig Start Date End Date Taking? Authorizing Provider  albuterol (PROVENTIL HFA;VENTOLIN HFA) 108 (90 BASE) MCG/ACT inhaler Inhale 2 puffs into the lungs every 4 (four) hours as needed for wheezing. 06/10/15  Yes Elvina SidleKurt Lauenstein, MD  benazepril-hydrochlorthiazide (LOTENSIN HCT) 10-12.5 MG tablet TAKE 1 TABLET BY MOUTH EVERY DAY 06/29/16  Yes Wallis BambergMario Mani, PA-C  cetirizine (ZYRTEC) 10 MG tablet Take 1 tablet (10 mg total) by mouth daily. 03/16/14  Yes  Elvina SidleKurt Lauenstein, MD  levothyroxine (SYNTHROID, LEVOTHROID) 50 MCG tablet TAKE 1 TABLET BY MOUTH EVERY MORNING BEFORE BREAKFAST 06/29/16  Yes Wallis BambergMario Mani, PA-C  pindolol (VISKEN) 10 MG tablet TAKE 1 TABLET BY MOUTH TWICE A DAY 08/28/15  Yes Elvina SidleKurt Lauenstein, MD    Allergies  Allergen Reactions  . Tramadol   . Xanax [Alprazolam]     Past Surgical History:  Procedure Laterality Date  . CESAREAN SECTION      Family History  Problem Relation Age of Onset  . Hyperlipidemia Mother   . Graves' disease Mother   . Hypertension Father   . COPD Father   . Multiple sclerosis Sister     Social History   Social History  . Marital status: Married    Spouse name: Neena RhymesLes Lambeth  . Number of children: 1  . Years of education: college   Occupational History  . Product/analytical chemist    Social History Main Topics  . Smoking status: Never Smoker  . Smokeless tobacco: Never Used  . Alcohol use 0.0 oz/week     Comment: previously drank 4 drinks/week, stopped in effort to lose weight  . Drug use: No  . Sexual activity: Yes    Partners: Male    Birth control/ protection: Other-see comments     Comment: ablation   Other Topics Concern  . None   Social History Narrative   Lives with her husband and their son.   Family lives nearby.       Review of Systems  Constitutional: Negative.  HENT: Negative.   Eyes: Negative.   Respiratory: Negative.   Cardiovascular: Negative.   Gastrointestinal: Negative.   Endocrine: Negative.   Genitourinary: Negative.   Musculoskeletal: Negative.   Skin: Negative.   Allergic/Immunologic: Negative.   Neurological: Negative for dizziness, tremors, seizures, syncope, facial asymmetry, speech difficulty, weakness, light-headedness, numbness and headaches.       Heightened sense of smell and notes odor of something burning.  Hematological: Negative.   Psychiatric/Behavioral: Negative.         Objective:  Physical Exam  Constitutional: She is  oriented to person, place, and time. Vital signs are normal. She appears well-developed and well-nourished. She is active and cooperative. No distress.  BP 114/72 (BP Location: Left Arm, Patient Position: Sitting, Cuff Size: Large)   Pulse 91   Temp 98.2 F (36.8 C) (Oral)   Resp 16   Ht 5' 0.5" (1.537 m)   Wt 220 lb (99.8 kg)   SpO2 96%   BMI 42.26 kg/m    HENT:  Head: Normocephalic and atraumatic.  Right Ear: Hearing, tympanic membrane, external ear and ear canal normal. No foreign bodies.  Left Ear: Hearing, tympanic membrane, external ear and ear canal normal. No foreign bodies.  Nose: Nose normal.  Mouth/Throat: Uvula is midline, oropharynx is clear and moist and mucous membranes are normal. No oral lesions. Normal dentition. No dental abscesses or uvula swelling. No oropharyngeal exudate.  Eyes: Conjunctivae, EOM and lids are normal. Pupils are equal, round, and reactive to light. Right eye exhibits no discharge. Left eye exhibits no discharge. No scleral icterus.  Fundoscopic exam:      The right eye shows no arteriolar narrowing, no AV nicking, no exudate, no hemorrhage and no papilledema. The right eye shows red reflex.       The left eye shows no arteriolar narrowing, no AV nicking, no exudate, no hemorrhage and no papilledema. The left eye shows red reflex.  Neck: Trachea normal, normal range of motion and full passive range of motion without pain. Neck supple. No spinous process tenderness and no muscular tenderness present. No thyroid mass and no thyromegaly present.  Cardiovascular: Normal rate, regular rhythm, normal heart sounds, intact distal pulses and normal pulses.   Pulmonary/Chest: Effort normal and breath sounds normal. Right breast exhibits no inverted nipple, no mass, no nipple discharge, no skin change and no tenderness. Left breast exhibits no inverted nipple, no mass, no nipple discharge, no skin change and no tenderness. Breasts are symmetrical.  Musculoskeletal:  She exhibits no edema or tenderness.       Cervical back: Normal.       Thoracic back: Normal.       Lumbar back: Normal.       Arms: Lymphadenopathy:       Head (right side): No tonsillar, no preauricular, no posterior auricular and no occipital adenopathy present.       Head (left side): No tonsillar, no preauricular, no posterior auricular and no occipital adenopathy present.    She has no cervical adenopathy.       Right: No supraclavicular adenopathy present.       Left: No supraclavicular adenopathy present.  Neurological: She is alert and oriented to person, place, and time. She has normal strength and normal reflexes. No cranial nerve deficit. She exhibits normal muscle tone. Coordination and gait normal.  Skin: Skin is warm, dry and intact. No rash noted. She is not diaphoretic. No cyanosis or erythema. Nails show no clubbing.  Psychiatric: She  has a normal mood and affect. Her speech is normal and behavior is normal. Judgment and thought content normal.        Visual Acuity Screening   Right eye Left eye Both eyes  Without correction: 20 25 20  40 20 40  With correction: 20 20 20 25 20 20    1. Annual physical exam Age appropriate anticipatory guidance provided.   2. Need for influenza vaccination - Flu Vaccine QUAD 36+ mos IM - Care order/instruction:  3. Hypothyroidism, unspecified hypothyroidism type She desires referral to endocrinology for management. - Ambulatory referral to Endocrinology - levothyroxine (SYNTHROID, LEVOTHROID) 50 MCG tablet; TAKE 1 TABLET BY MOUTH EVERY MORNING BEFORE BREAKFAST  Dispense: 90 tablet; Refill: 4  4. Benign essential HTN Controlled. Stable. Continue current treatment. - pindolol (VISKEN) 10 MG tablet; TAKE 1 TABLET BY MOUTH TWICE A DAY  Dispense: 180 tablet; Refill: 4 - benazepril-hydrochlorthiazide (LOTENSIN HCT) 10-12.5 MG tablet; Take 1 tablet by mouth daily.  Dispense: 90 tablet; Refill: 4  5. Environmental  allergies Stable.  6. Asthma, mild intermittent, uncomplicated Stable. - albuterol (PROVENTIL HFA;VENTOLIN HFA) 108 (90 Base) MCG/ACT inhaler; Inhale 2 puffs into the lungs every 4 (four) hours as needed for wheezing.  Dispense: 1 Inhaler; Refill: 2  7. Sense of smell altered Unclear etiology. Doesn't seem related to allergies. Given her concern for MS, refer to neurology. - Ambulatory referral to Neurology  8. Taste sense altered See above. - Ambulatory referral to Neurology   Fernande Bras, PA-C Physician Assistant-Certified [facility]     Assessment & Plan:

## 2016-09-23 ENCOUNTER — Ambulatory Visit (INDEPENDENT_AMBULATORY_CARE_PROVIDER_SITE_OTHER): Payer: BLUE CROSS/BLUE SHIELD | Admitting: Neurology

## 2016-09-23 ENCOUNTER — Encounter: Payer: Self-pay | Admitting: Neurology

## 2016-09-23 VITALS — BP 137/86 | HR 90 | Ht 60.75 in | Wt 212.0 lb

## 2016-09-23 DIAGNOSIS — R430 Anosmia: Secondary | ICD-10-CM | POA: Diagnosis not present

## 2016-09-23 NOTE — Progress Notes (Signed)
Reason for visit: Altered sense of smell  Referring physician: Dr. Pearline Fernandez is a 45 y.o. female  History of present illness:  Renee Fernandez is a 45 year old right-handed white female with a history of alteration in taste and smell sensation that began around the early part of July 2017. The patient has also begun to have bifrontal headaches that are occurring once or twice a week. The patient does not relate the alteration in taste and smell with the headaches. The taste sensation alteration is present at all times. It has improved slightly over time, but is still a problem for her. She indicates that things that used to taste good such as nuts and chocolate now have a chemical odor and taste. The patient has been losing some weight because of this, she has lost about 8 pounds since July. The patient denies any other associated symptoms such as nasal stuffiness, speech alteration, memory changes, or any problems with numbness or weakness of the face, arms, or legs. She denies issues controlling the bowels or the bladder, with balance, or any problems with dizziness. She does have some issues with near syncope associated with laughing vigorously. This has been a problem over the last one year. She has had some blood work that included a chemistry profile and thyroid profile that was unremarkable. She is sent to this office for an evaluation. She denies any prior history of head trauma and she has never been a smoker.  Past Medical History:  Diagnosis Date  . Allergy   . Arthritis   . Asthma   . Carpal tunnel syndrome   . Hypertension   . Neck pain   . Thyroid disease     Past Surgical History:  Procedure Laterality Date  . CESAREAN SECTION  2007    Family History  Problem Relation Age of Onset  . Hyperlipidemia Mother   . Graves' disease Mother   . Hypertension Father   . COPD Father   . Multiple sclerosis Sister   . Heart disease Maternal Grandfather      Social history:  reports that she has never smoked. She has never used smokeless tobacco. She reports that she drinks alcohol. She reports that she does not use drugs.  Medications:  Prior to Admission medications   Medication Sig Start Date End Date Taking? Authorizing Provider  albuterol (PROVENTIL HFA;VENTOLIN HFA) 108 (90 Base) MCG/ACT inhaler Inhale 2 puffs into the lungs every 4 (four) hours as needed for wheezing. 08/13/16  Yes Chelle Jeffery, PA-C  benazepril-hydrochlorthiazide (LOTENSIN HCT) 10-12.5 MG tablet Take 1 tablet by mouth daily. 08/13/16  Yes Chelle Jeffery, PA-C  cetirizine (ZYRTEC) 10 MG tablet Take 1 tablet (10 mg total) by mouth daily. 03/16/14  Yes Elvina Sidle, MD  cyclobenzaprine (FLEXERIL) 10 MG tablet  09/15/16  Yes Historical Provider, MD  levothyroxine (SYNTHROID, LEVOTHROID) 50 MCG tablet TAKE 1 TABLET BY MOUTH EVERY MORNING BEFORE BREAKFAST 08/13/16  Yes Chelle Jeffery, PA-C  pindolol (VISKEN) 10 MG tablet TAKE 1 TABLET BY MOUTH TWICE A DAY 08/13/16  Yes Chelle Jeffery, PA-C      Allergies  Allergen Reactions  . Tramadol   . Xanax [Alprazolam]     ROS:  Out of a complete 14 system review of symptoms, the patient complains only of the following symptoms, and all other reviewed systems are negative.  Weight loss Episodes of near-syncope Snoring Change in appetite  Blood pressure 137/86, pulse 90, height 5' 0.75" (1.543 m), weight  212 lb (96.2 kg).  Physical Exam  General: The patient is alert and cooperative at the time of the examination. The patient is markedly obese.  Eyes: Pupils are equal, round, and reactive to light. Discs are flat bilaterally.  Neck: The neck is supple, no carotid bruits are noted.  Respiratory: The respiratory examination is clear.  Cardiovascular: The cardiovascular examination reveals a regular rate and rhythm, no obvious murmurs or rubs are noted.  Skin: Extremities are without significant edema.  Neurologic  Exam  Mental status: The patient is alert and oriented x 3 at the time of the examination. The patient has apparent normal recent and remote memory, with an apparently normal attention span and concentration ability.  Cranial nerves: Facial symmetry is present. There is good sensation of the face to pinprick and soft touch bilaterally. The strength of the facial muscles and the muscles to head turning and shoulder shrug are normal bilaterally. Speech is well enunciated, no aphasia or dysarthria is noted. Extraocular movements are full. Visual fields are full. The tongue is midline, and the patient has symmetric elevation of the soft palate. No obvious hearing deficits are noted.  Motor: The motor testing reveals 5 over 5 strength of all 4 extremities. Good symmetric motor tone is noted throughout.  Sensory: Sensory testing is intact to pinprick, soft touch, vibration sensation, and position sense on all 4 extremities. No evidence of extinction is noted.  Coordination: Cerebellar testing reveals good finger-nose-finger and heel-to-shin bilaterally.  Gait and station: Gait is normal. Tandem gait is normal. Romberg is negative. No drift is seen.  Reflexes: Deep tendon reflexes are symmetric and normal bilaterally. Toes are downgoing bilaterally.   Assessment/Plan:  1. Altered taste and smell sensation  2. Bifrontal headache  The patient has had onset of headache and altered taste and smell sensation within the last several months. The patient will undergo MRI evaluation of the brain to evaluate the sinuses and to exclude intracranial disease. I will discuss the results with her once the study is done. I have recommended that she go on a multivitamin that includes zinc and selenium.  Renee Palau. Keith Sebrena Engh MD 09/23/2016 8:38 AM  [facility] [address] [place], [place] [id number]  Phone [phone] Fax [phone]

## 2016-09-28 ENCOUNTER — Telehealth: Payer: Self-pay | Admitting: Neurology

## 2016-09-28 NOTE — Telephone Encounter (Signed)
Pt called request to schedule MRI

## 2016-10-06 NOTE — Telephone Encounter (Signed)
Called patient to let her know that her [facility].

## 2016-10-19 ENCOUNTER — Other Ambulatory Visit: Payer: BLUE CROSS/BLUE SHIELD

## 2016-11-17 ENCOUNTER — Other Ambulatory Visit: Payer: Self-pay | Admitting: Physician Assistant

## 2016-11-17 DIAGNOSIS — Z1231 Encounter for screening mammogram for malignant neoplasm of breast: Secondary | ICD-10-CM

## 2016-11-24 ENCOUNTER — Ambulatory Visit: Payer: BLUE CROSS/BLUE SHIELD

## 2016-12-12 ENCOUNTER — Encounter: Payer: Self-pay | Admitting: Physician Assistant

## 2016-12-12 DIAGNOSIS — M79601 Pain in right arm: Secondary | ICD-10-CM | POA: Insufficient documentation

## 2017-04-15 ENCOUNTER — Other Ambulatory Visit: Payer: Self-pay | Admitting: Physician Assistant

## 2017-04-15 DIAGNOSIS — Z1231 Encounter for screening mammogram for malignant neoplasm of breast: Secondary | ICD-10-CM

## 2017-05-05 ENCOUNTER — Ambulatory Visit: Payer: BLUE CROSS/BLUE SHIELD

## 2017-05-20 ENCOUNTER — Ambulatory Visit
Admission: RE | Admit: 2017-05-20 | Discharge: 2017-05-20 | Disposition: A | Discharge status: Home / Self Care | Payer: BLUE CROSS/BLUE SHIELD | Source: Ambulatory Visit | Attending: Physician Assistant | Admitting: Physician Assistant

## 2017-05-20 DIAGNOSIS — Z1231 Encounter for screening mammogram for malignant neoplasm of breast: Secondary | ICD-10-CM

## 2017-06-01 DIAGNOSIS — M4722 Other spondylosis with radiculopathy, cervical region: Secondary | ICD-10-CM | POA: Insufficient documentation

## 2017-06-01 DIAGNOSIS — M67911 Unspecified disorder of synovium and tendon, right shoulder: Secondary | ICD-10-CM | POA: Insufficient documentation

## 2017-06-02 ENCOUNTER — Encounter: Payer: Self-pay | Admitting: Physician Assistant

## 2017-06-21 DIAGNOSIS — M47812 Spondylosis without myelopathy or radiculopathy, cervical region: Secondary | ICD-10-CM | POA: Insufficient documentation

## 2017-08-24 ENCOUNTER — Other Ambulatory Visit: Payer: Self-pay | Admitting: Physician Assistant

## 2017-08-24 DIAGNOSIS — I10 Essential (primary) hypertension: Secondary | ICD-10-CM

## 2017-08-27 ENCOUNTER — Other Ambulatory Visit: Payer: Self-pay

## 2017-09-26 ENCOUNTER — Other Ambulatory Visit: Payer: Self-pay | Admitting: Physician Assistant

## 2017-09-26 DIAGNOSIS — I10 Essential (primary) hypertension: Secondary | ICD-10-CM

## 2017-10-22 ENCOUNTER — Other Ambulatory Visit: Payer: Self-pay

## 2017-10-22 ENCOUNTER — Telehealth: Payer: Self-pay | Admitting: Physician Assistant

## 2017-10-22 ENCOUNTER — Encounter: Payer: BLUE CROSS/BLUE SHIELD | Admitting: Physician Assistant

## 2017-10-22 DIAGNOSIS — I10 Essential (primary) hypertension: Secondary | ICD-10-CM

## 2017-10-22 MED ORDER — BENAZEPRIL-HYDROCHLOROTHIAZIDE 10-12.5 MG PO TABS: 1.0000 | ORAL_TABLET | Freq: Every day | ORAL | 0 refills | Status: DC

## 2017-10-22 MED ORDER — PINDOLOL 10 MG PO TABS: 10.0000 mg | ORAL_TABLET | Freq: Two times a day (BID) | ORAL | 0 refills | Status: DC

## 2017-10-22 NOTE — Telephone Encounter (Signed)
Pt is needing a refill on pindolol and benazepril-hydrochlorthiazide    Best number is [phone]

## 2017-10-22 NOTE — Telephone Encounter (Signed)
Rx refilled for 30 days. Pt was late for her appointment today and didn't reschedule.

## 2017-11-18 ENCOUNTER — Other Ambulatory Visit: Payer: Self-pay | Admitting: Physician Assistant

## 2017-11-18 DIAGNOSIS — E039 Hypothyroidism, unspecified: Secondary | ICD-10-CM

## 2017-11-18 NOTE — Telephone Encounter (Signed)
Last visit pertaining to this was 08/13/16.   Needs appt prior to refilling the Synthroid?   Thanks.  Pt of Chelle Leotis ShamesJeffery

## 2017-12-02 ENCOUNTER — Other Ambulatory Visit: Payer: Self-pay | Admitting: Physician Assistant

## 2017-12-02 DIAGNOSIS — I10 Essential (primary) hypertension: Secondary | ICD-10-CM

## 2017-12-11 ENCOUNTER — Ambulatory Visit (INDEPENDENT_AMBULATORY_CARE_PROVIDER_SITE_OTHER): Payer: BC Managed Care – PPO | Admitting: Physician Assistant

## 2017-12-11 ENCOUNTER — Other Ambulatory Visit: Payer: Self-pay

## 2017-12-11 ENCOUNTER — Encounter: Payer: Self-pay | Admitting: Physician Assistant

## 2017-12-11 VITALS — BP 124/82 | HR 76 | Temp 98.5°F | Resp 18 | Ht 60.75 in | Wt 211.4 lb

## 2017-12-11 DIAGNOSIS — Z111 Encounter for screening for respiratory tuberculosis: Secondary | ICD-10-CM

## 2017-12-11 DIAGNOSIS — R29818 Other symptoms and signs involving the nervous system: Secondary | ICD-10-CM

## 2017-12-11 DIAGNOSIS — E039 Hypothyroidism, unspecified: Secondary | ICD-10-CM | POA: Diagnosis not present

## 2017-12-11 DIAGNOSIS — Z114 Encounter for screening for human immunodeficiency virus [HIV]: Secondary | ICD-10-CM | POA: Diagnosis not present

## 2017-12-11 DIAGNOSIS — Z6841 Body Mass Index (BMI) 40.0 and over, adult: Secondary | ICD-10-CM | POA: Diagnosis not present

## 2017-12-11 DIAGNOSIS — I10 Essential (primary) hypertension: Secondary | ICD-10-CM | POA: Diagnosis not present

## 2017-12-11 DIAGNOSIS — J4520 Mild intermittent asthma, uncomplicated: Secondary | ICD-10-CM

## 2017-12-11 DIAGNOSIS — Z23 Encounter for immunization: Secondary | ICD-10-CM

## 2017-12-11 DIAGNOSIS — M47812 Spondylosis without myelopathy or radiculopathy, cervical region: Secondary | ICD-10-CM

## 2017-12-11 DIAGNOSIS — Z0000 Encounter for general adult medical examination without abnormal findings: Secondary | ICD-10-CM

## 2017-12-11 LAB — POCT URINALYSIS DIP (MANUAL ENTRY)
Bilirubin, UA: NEGATIVE
Glucose, UA: NEGATIVE mg/dL
Ketones, POC UA: NEGATIVE mg/dL
Nitrite, UA: NEGATIVE
PH UA: 7 (ref 5.0–8.0)
PROTEIN UA: NEGATIVE mg/dL
SPEC GRAV UA: 1.02 (ref 1.010–1.025)
UROBILINOGEN UA: 0.2 U/dL

## 2017-12-11 MED ORDER — BENAZEPRIL-HYDROCHLOROTHIAZIDE 10-12.5 MG PO TABS: 1.0000 | ORAL_TABLET | Freq: Every day | ORAL | 3 refills | Status: AC

## 2017-12-11 MED ORDER — OXYCODONE-ACETAMINOPHEN 5-325 MG PO TABS: 1.0000 | ORAL_TABLET | Freq: Four times a day (QID) | ORAL | 0 refills | Status: AC | PRN

## 2017-12-11 MED ORDER — PINDOLOL 10 MG PO TABS: 10.0000 mg | ORAL_TABLET | Freq: Two times a day (BID) | ORAL | 3 refills | Status: AC

## 2017-12-11 MED ORDER — CYCLOBENZAPRINE HCL 10 MG PO TABS: 10.0000 mg | ORAL_TABLET | Freq: Every evening | ORAL | 3 refills | Status: AC | PRN

## 2017-12-11 MED ORDER — ALBUTEROL SULFATE HFA 108 (90 BASE) MCG/ACT IN AERS: 2.0000 | INHALATION_SPRAY | RESPIRATORY_TRACT | 2 refills | Status: AC | PRN

## 2017-12-11 MED ORDER — LEVOTHYROXINE SODIUM 50 MCG PO TABS: ORAL_TABLET | ORAL | 3 refills | Status: AC

## 2017-12-11 NOTE — Progress Notes (Signed)
Patient ID: Renee Fernandez, female    DOB: 11/24/1971, 46 y.o.   MRN: [id number]  PCP: Porfirio OarJeffery, Dannie Woolen, PA-C  Chief Complaint  Patient presents with  . Annual Exam    No PAP needed.    Subjective:   Presents for Annual Exam.  Needs Quantiferon Gold test for TB screening for work. 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 It's been hard, but suits her as an extroverted personality.   Her former orthopedic surgeon used to give her a supply of oxycodone for her back and neck pain. Her new specialist advised her to ask me for it.  Is getting ready mentally to return to regular exercise.  Cervical Cancer Screening: Sees GYN. Breast Cancer Screening: mammogram 05/20/2017. Inconsistent SBE and CBE. Colorectal Cancer Screening: not yet a candidate. Bone Density Testing: not yet a candidate. HIV Screening: Not yet. declines. STI Screening: very low risk Seasonal Influenza Vaccination: today Td/Tdap Vaccination: due. Declines. Pneumococcal Vaccination: not yet a candidate. Zoster Vaccination: not yet a candidate. Frequency of Dental evaluation: Q6 months until 3 years ago, plans to schedule summer 2019 Frequency of Eye evaluation: annually   Review of Systems  Constitutional: Positive for fatigue. Negative for activity change, appetite change, chills, diaphoresis, fever and unexpected weight change.  HENT: Negative.   Eyes: Negative.   Respiratory: Negative.   Cardiovascular: Negative.   Gastrointestinal: Negative.   Endocrine: Negative.   Genitourinary: Negative.        Did have some breast tenderness and fullness, which resolved. Worried that her breasts will become larger, her mother's are "humongous," and wants to avoid that.  Musculoskeletal: Positive for arthralgias, back pain, myalgias and neck pain. Negative for gait problem, joint swelling and neck stiffness.  Skin: Negative.   Allergic/Immunologic: Negative.   Neurological: Negative.     Hematological: Negative.   Psychiatric/Behavioral: Positive for sleep disturbance (loud snoring, pauses in breathing, dreams that she is choking and awakens gasping. Doesn't want to use CPAP because "it doesn't look fun." Her husband uses CPAP and loves it.). Negative for agitation, behavioral problems, confusion, decreased concentration, dysphoric mood, hallucinations, self-injury and suicidal ideas. The patient is not nervous/anxious and is not hyperactive.        Patient Active Problem List   Diagnosis Date Noted  . Spondylosis of cervical region without myelopathy or radiculopathy 06/21/2017  . Osteoarthritis of spine with radiculopathy, cervical region 06/01/2017  . Rotator cuff disorder, right 06/01/2017  . Right arm pain 12/12/2016  . Anosmia 09/23/2016  . Hypothyroidism 08/13/2016  . Benign essential HTN 08/13/2016  . Environmental allergies 08/13/2016     Prior to Admission medications   Medication Sig Start Date End Date Taking? Authorizing Provider  albuterol (PROVENTIL HFA;VENTOLIN HFA) 108 (90 Base) MCG/ACT inhaler Inhale 2 puffs into the lungs every 4 (four) hours as needed for wheezing. 08/13/16  Yes Myrna Vonseggern, PA-C  benazepril-hydrochlorthiazide (LOTENSIN HCT) 10-12.5 MG tablet Take 1 tablet by mouth daily. 10/22/17  Yes Janely Gullickson, PA-C  cetirizine (ZYRTEC) 10 MG tablet Take 1 tablet (10 mg total) by mouth daily. 03/16/14  Yes Elvina SidleLauenstein, Kurt, MD  cyclobenzaprine (FLEXERIL) 10 MG tablet  09/15/16  Yes [provider]  levothyroxine (SYNTHROID, LEVOTHROID) 50 MCG tablet TAKE 1 TABLET BY MOUTH EVERY DAY IN THE MORNING BEFORE BREAKFAST Office visit needed for any additional refills 11/18/17  Yes Anyelo Mccue, PA-C  pindolol (VISKEN) 10 MG tablet TAKE 1 TABLET BY MOUTH TWICE A DAY 12/04/17  Yes Porfirio Oar, PA-C     Allergies  Allergen Reactions  . Tramadol   . Xanax [Alprazolam]        Objective:  Physical Exam  Constitutional: She is  oriented to person, place, and time. Vital signs are normal. She appears well-developed and well-nourished. She is active and cooperative. No distress.  BP 124/82 (BP Location: Left Arm, Patient Position: Sitting, Cuff Size: Large)   Pulse 76   Temp 98.5 F (36.9 C) (Oral)   Resp 18   Ht 5' 0.75" (1.543 m)   Wt 211 lb 6.4 oz (95.9 kg)   SpO2 97%   BMI 40.27 kg/m    HENT:  Head: Normocephalic and atraumatic.  Right Ear: Hearing, tympanic membrane, external ear and ear canal normal. No foreign bodies.  Left Ear: Hearing, tympanic membrane, external ear and ear canal normal. No foreign bodies.  Nose: Nose normal.  Mouth/Throat: Uvula is midline, oropharynx is clear and moist and mucous membranes are normal. No oral lesions. Normal dentition. No dental abscesses or uvula swelling. No oropharyngeal exudate.  Eyes: Conjunctivae, EOM and lids are normal. Pupils are equal, round, and reactive to light. Right eye exhibits no discharge. Left eye exhibits no discharge. No scleral icterus.  Fundoscopic exam:      The right eye shows no arteriolar narrowing, no AV nicking, no exudate, no hemorrhage and no papilledema. The right eye shows red reflex.       The left eye shows no arteriolar narrowing, no AV nicking, no exudate, no hemorrhage and no papilledema. The left eye shows red reflex.  Neck: Trachea normal, normal range of motion and full passive range of motion without pain. Neck supple. No spinous process tenderness and no muscular tenderness present. No thyroid mass and no thyromegaly present.  Cardiovascular: Normal rate, regular rhythm, normal heart sounds, intact distal pulses and normal pulses.  Pulmonary/Chest: Effort normal and breath sounds normal. Right breast exhibits no inverted nipple, no mass, no nipple discharge, no skin change and no tenderness. Left breast exhibits no inverted nipple, no mass, no nipple discharge, no skin change and no tenderness. Breasts are asymmetrical (R>L size).   Abdominal: Soft. Bowel sounds are normal. She exhibits no mass. There is no tenderness.  Musculoskeletal: She exhibits no edema or tenderness.       Cervical back: Normal.       Thoracic back: Normal.       Lumbar back: Normal.       Arms: Lymphadenopathy:       Head (right side): No tonsillar, no preauricular, no posterior auricular and no occipital adenopathy present.       Head (left side): No tonsillar, no preauricular, no posterior auricular and no occipital adenopathy present.    She has no cervical adenopathy.       Right: No supraclavicular adenopathy present.       Left: No supraclavicular adenopathy present.  Neurological: She is alert and oriented to person, place, and time. She has normal strength and normal reflexes. No cranial nerve deficit. She exhibits normal muscle tone. Coordination and gait normal.  Skin: Skin is warm, dry and intact. No rash noted. She is not diaphoretic. No cyanosis or erythema. Nails show no clubbing.  Psychiatric: She has a normal mood and affect. Her speech is normal and behavior is normal. Judgment and thought content normal.           Assessment & Plan:   Problem List Items Addressed This Visit  Hypothyroidism    Await labs. Adjust regimen as indicated by results.      Relevant Medications   levothyroxine (SYNTHROID, LEVOTHROID) 50 MCG tablet   pindolol (VISKEN) 10 MG tablet   Other Relevant Orders   TSH   Benign essential HTN    Controlled. Continue current regimen.      Relevant Medications   benazepril-hydrochlorthiazide (LOTENSIN HCT) 10-12.5 MG tablet   pindolol (VISKEN) 10 MG tablet   Other Relevant Orders   CBC with Differential/Platelet   Comprehensive metabolic panel   Lipid panel   POCT urinalysis dipstick   Spondylosis of cervical region without myelopathy or radiculopathy    Provided refills of cyclobenzaprine and oxycodone, with expectation that the opiate will be used sparingly and the #30 tablets will last  6-12 months.      Relevant Medications   meloxicam (MOBIC) 15 MG tablet   cyclobenzaprine (FLEXERIL) 10 MG tablet   oxyCODONE-acetaminophen (PERCOCET/ROXICET) 5-325 MG tablet   Suspected sleep apnea    Refer to sleep medicine for additional evaluation and treatment.      Relevant Orders   Ambulatory referral to Sleep Studies   Morbid obesity (HCC)   BMI 40.0-44.9, adult (HCC)    Reduce calories and increase exercise. Evaluate and treat OSA.        Other Visit Diagnoses    Annual physical exam    -  Primary   Age appropriate health guidance provided   Need for influenza vaccination       Relevant Orders   Flu Vaccine QUAD 36+ mos IM (Completed)   Need for Tdap vaccination       declined   Screening for HIV (human immunodeficiency virus)       Relevant Orders   HIV antibody   Screening-pulmonary TB       Relevant Orders   Quantiferon tb gold assay   Mild intermittent asthma without complication       Relevant Medications   albuterol (PROVENTIL HFA;VENTOLIN HFA) 108 (90 Base) MCG/ACT inhaler       Return in about 6 months (around 06/11/2018) for re-evalaution of blood pressure, thyroid.   Fernande Brashelle S. Raima Geathers, PA-C [facility]

## 2017-12-11 NOTE — Assessment & Plan Note (Signed)
Refer to sleep medicine for additional evaluation and treatment.

## 2017-12-11 NOTE — Patient Instructions (Addendum)
   IF you received an x-ray today, you will receive an invoice from [facility]. Please contact [facility] at [phone] with questions or concerns regarding your invoice.   IF you received labwork today, you will receive an invoice from [facility]. Please contact [facility] at [phone] with questions or concerns regarding your invoice.   Our billing staff will not be able to assist you with questions regarding bills from these companies.  You will be contacted with the lab results as soon as they are available. The fastest way to get your results is to activate your My Chart account. Instructions are located on the last page of this paperwork. If you have not heard from us regarding the results in 2 weeks, please contact this office.     Preventive Care 40-64 Years, Female Preventive care refers to lifestyle choices and visits with your health care provider that can promote health and wellness. What does preventive care include?  A yearly physical exam. This is also called an annual well check.  Dental exams once or twice a year.  Routine eye exams. Ask your health care provider how often you should have your eyes checked.  Personal lifestyle choices, including: ? Daily care of your teeth and gums. ? Regular physical activity. ? Eating a healthy diet. ? Avoiding tobacco and drug use. ? Limiting alcohol use. ? Practicing safe sex. ? Taking low-dose aspirin daily starting at age 50. ? Taking vitamin and mineral supplements as recommended by your health care provider. What happens during an annual well check? The services and screenings done by your health care provider during your annual well check will depend on your age, overall health, lifestyle risk factors, and family history of disease. Counseling Your health care provider may ask you questions about your:  Alcohol use.  Tobacco use.  Drug use.  Emotional well-being.  Home and relationship  well-being.  Sexual activity.  Eating habits.  Work and work environment.  Method of birth control.  Menstrual cycle.  Pregnancy history.  Screening You may have the following tests or measurements:  Height, weight, and BMI.  Blood pressure.  Lipid and cholesterol levels. These may be checked every 5 years, or more frequently if you are over 50 years old.  Skin check.  Lung cancer screening. You may have this screening every year starting at age 55 if you have a 30-pack-year history of smoking and currently smoke or have quit within the past 15 years.  Fecal occult blood test (FOBT) of the stool. You may have this test every year starting at age 50.  Flexible sigmoidoscopy or colonoscopy. You may have a sigmoidoscopy every 5 years or a colonoscopy every 10 years starting at age 50.  Hepatitis C blood test.  Hepatitis B blood test.  Sexually transmitted disease (STD) testing.  Diabetes screening. This is done by checking your blood sugar (glucose) after you have not eaten for a while (fasting). You may have this done every 1-3 years.  Mammogram. This may be done every 1-2 years. Talk to your health care provider about when you should start having regular mammograms. This may depend on whether you have a family history of breast cancer.  BRCA-related cancer screening. This may be done if you have a family history of breast, ovarian, tubal, or peritoneal cancers.  Pelvic exam and Pap test. This may be done every 3 years starting at age 21. Starting at age 30, this may be done every 5 years if you   have a Pap test in combination with an HPV test.  Bone density scan. This is done to screen for osteoporosis. You may have this scan if you are at high risk for osteoporosis.  Discuss your test results, treatment options, and if necessary, the need for more tests with your health care provider. Vaccines Your health care provider may recommend certain vaccines, such  as:  Influenza vaccine. This is recommended every year.  Tetanus, diphtheria, and acellular pertussis (Tdap, Td) vaccine. You may need a Td booster every 10 years.  Varicella vaccine. You may need this if you have not been vaccinated.  Zoster vaccine. You may need this after age 60.  Measles, mumps, and rubella (MMR) vaccine. You may need at least one dose of MMR if you were born in 1957 or later. You may also need a second dose.  Pneumococcal 13-valent conjugate (PCV13) vaccine. You may need this if you have certain conditions and were not previously vaccinated.  Pneumococcal polysaccharide (PPSV23) vaccine. You may need one or two doses if you smoke cigarettes or if you have certain conditions.  Meningococcal vaccine. You may need this if you have certain conditions.  Hepatitis A vaccine. You may need this if you have certain conditions or if you travel or work in places where you may be exposed to hepatitis A.  Hepatitis B vaccine. You may need this if you have certain conditions or if you travel or work in places where you may be exposed to hepatitis B.  Haemophilus influenzae type b (Hib) vaccine. You may need this if you have certain conditions.  Talk to your health care provider about which screenings and vaccines you need and how often you need them. This information is not intended to replace advice given to you by your health care provider. Make sure you discuss any questions you have with your health care provider. Document Released: 01/03/2016 Document Revised: 08/26/2016 Document Reviewed: 10/08/2015 Elsevier Interactive Patient Education  2018 [employer] Inc[employer]  

## 2017-12-11 NOTE — Assessment & Plan Note (Signed)
Provided refills of cyclobenzaprine and oxycodone, with expectation that the opiate will be used sparingly and the #30 tablets will last 6-12 months.

## 2017-12-11 NOTE — Assessment & Plan Note (Signed)
Reduce calories and increase exercise. Evaluate and treat OSA.

## 2017-12-11 NOTE — Assessment & Plan Note (Signed)
Await labs. Adjust regimen as indicated by results.  

## 2017-12-11 NOTE — Assessment & Plan Note (Signed)
Controlled. Continue current regimen. 

## 2017-12-13 LAB — QUANTIFERON-TB GOLD PLUS

## 2017-12-15 LAB — COMPREHENSIVE METABOLIC PANEL
A/G RATIO: 1.7 (ref 1.2–2.2)
ALT: 13 IU/L (ref 0–32)
AST: 16 IU/L (ref 0–40)
Albumin: 4.1 g/dL (ref 3.5–5.5)
Alkaline Phosphatase: 86 IU/L (ref 39–117)
BUN/Creatinine Ratio: 16 (ref 9–23)
BUN: 14 mg/dL (ref 6–24)
Bilirubin Total: 0.4 mg/dL (ref 0.0–1.2)
CALCIUM: 9 mg/dL (ref 8.7–10.2)
CO2: 22 mmol/L (ref 20–29)
CREATININE: 0.85 mg/dL (ref 0.57–1.00)
Chloride: 105 mmol/L (ref 96–106)
GFR, EST AFRICAN AMERICAN: 94 mL/min/{1.73_m2} (ref >59)
GFR, EST NON AFRICAN AMERICAN: 81 mL/min/{1.73_m2} (ref >59)
GLOBULIN, TOTAL: 2.4 g/dL (ref 1.5–4.5)
Glucose: 93 mg/dL (ref 65–99)
POTASSIUM: 5 mmol/L (ref 3.5–5.2)
Sodium: 140 mmol/L (ref 134–144)
TOTAL PROTEIN: 6.5 g/dL (ref 6.0–8.5)

## 2017-12-15 LAB — LIPID PANEL
CHOL/HDL RATIO: 2.6 ratio (ref 0.0–4.4)
Cholesterol, Total: 191 mg/dL (ref 100–199)
HDL: 74 mg/dL (ref >39)
LDL CALC: 106 mg/dL — AB (ref 0–99)
TRIGLYCERIDES: 57 mg/dL (ref 0–149)
VLDL CHOLESTEROL CAL: 11 mg/dL (ref 5–40)

## 2017-12-15 LAB — HIV ANTIBODY (ROUTINE TESTING W REFLEX): HIV Screen 4th Generation wRfx: NONREACTIVE

## 2017-12-15 LAB — CBC WITH DIFFERENTIAL/PLATELET

## 2017-12-15 LAB — TSH: TSH: 1.87 u[IU]/mL (ref 0.450–4.500)

## 2018-03-24 ENCOUNTER — Encounter: Payer: Self-pay | Admitting: Physician Assistant

## 2018-03-31 ENCOUNTER — Telehealth: Payer: Self-pay | Admitting: Physician Assistant

## 2018-03-31 NOTE — Telephone Encounter (Signed)
Called and spoke with pt about [facility] leaving. Pt chose to make an apt with Sarah Weber. I advised of building number, time and late policy. I also advised pt that she would be receiving a letter from PCP. °

## 2018-06-14 ENCOUNTER — Ambulatory Visit: Payer: BC Managed Care – PPO | Admitting: Physician Assistant

## 2018-06-17 ENCOUNTER — Ambulatory Visit: Payer: BC Managed Care – PPO | Admitting: Physician Assistant

## 2018-08-05 ENCOUNTER — Ambulatory Visit: Payer: BC Managed Care – PPO | Admitting: Physician Assistant

## 2018-09-27 ENCOUNTER — Emergency Department
Admission: EM | Admit: 2018-09-27 | Discharge: 2018-09-27 | Disposition: A | Discharge status: Home / Self Care | Payer: BC Managed Care – PPO | Source: Home / Self Care | Attending: Family Medicine | Admitting: Family Medicine

## 2018-09-27 ENCOUNTER — Encounter: Payer: Self-pay | Admitting: Emergency Medicine

## 2018-09-27 ENCOUNTER — Emergency Department (INDEPENDENT_AMBULATORY_CARE_PROVIDER_SITE_OTHER): Payer: BC Managed Care – PPO

## 2018-09-27 DIAGNOSIS — M79662 Pain in left lower leg: Secondary | ICD-10-CM | POA: Diagnosis not present

## 2018-09-27 DIAGNOSIS — M1712 Unilateral primary osteoarthritis, left knee: Secondary | ICD-10-CM

## 2018-09-27 DIAGNOSIS — M25862 Other specified joint disorders, left knee: Secondary | ICD-10-CM

## 2018-09-27 DIAGNOSIS — S86899A Other injury of other muscle(s) and tendon(s) at lower leg level, unspecified leg, initial encounter: Secondary | ICD-10-CM | POA: Diagnosis not present

## 2018-09-27 MED ORDER — HYDROCODONE-ACETAMINOPHEN 5-325 MG PO TABS: ORAL_TABLET | ORAL | 0 refills | Status: AC

## 2018-09-27 MED ORDER — ETODOLAC 400 MG PO TABS: ORAL_TABLET | ORAL | 1 refills | Status: AC

## 2018-09-27 NOTE — ED Triage Notes (Signed)
Pt c/o bilateral leg and feet pain for the last month that is worse with sitting. She is seeing a vein specialist 10/29. 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

## 2018-09-27 NOTE — ED Provider Notes (Signed)
[facility]    CSN: [id number] Arrival date & time: 09/27/18  1627     History   Chief Complaint Chief Complaint  Patient presents with  . Leg Pain    HPI Renee Fernandez is a 47 y.o. female.   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  She stands at work and complains of increasing pain in both legs, worse on the left.  The pain is now worse when sitting.  She denies swelling, erythema, or warmth.  The pain awakens her at night.  The history is provided by the patient.  Leg Pain  Location:  Leg Time since incident:  6 weeks Injury: no   Leg location:  L upper leg, R upper leg, L lower leg and R lower leg Pain details:    Quality:  Aching   Radiates to:  Does not radiate   Severity:  Moderate   Onset quality:  Sudden   Duration:  6 weeks   Timing:  Constant   Progression:  Worsening Chronicity:  New Prior injury to area:  No Relieved by:  Nothing Worsened by:  Bearing weight and activity (sitting) Ineffective treatments:  NSAIDs Associated symptoms: decreased ROM and stiffness   Associated symptoms: no back pain, no fever, no muscle weakness, no numbness, no swelling and no tingling   Risk factors: obesity     Past Medical History:  Diagnosis Date  . Allergy   . Arthritis   . Asthma   . Carpal tunnel syndrome   . Hypertension   . Neck pain   . Thyroid disease     Patient Active Problem List   Diagnosis Date Noted  . Suspected sleep apnea 12/11/2017  . Morbid obesity (HCC) 12/11/2017  . BMI 40.0-44.9, adult (HCC) 12/11/2017  . Spondylosis of cervical region without myelopathy or radiculopathy 06/21/2017  . Osteoarthritis of spine with radiculopathy, cervical region 06/01/2017  . Rotator cuff disorder, right 06/01/2017  . Right arm pain 12/12/2016  . Anosmia 09/23/2016  . Hypothyroidism 08/13/2016  . Benign essential HTN 08/13/2016  . Environmental allergies 08/13/2016    Past Surgical History:  Procedure Laterality  Date  . CESAREAN SECTION  2007    OB History   None      Home Medications    Prior to Admission medications   Medication Sig Start Date End Date Taking? Authorizing Provider  albuterol (PROVENTIL HFA;VENTOLIN HFA) 108 (90 Base) MCG/ACT inhaler Inhale 2 puffs into the lungs every 4 (four) hours as needed for wheezing. 12/11/17   Porfirio Oar, PA  benazepril-hydrochlorthiazide (LOTENSIN HCT) 10-12.5 MG tablet Take 1 tablet by mouth daily. 12/11/17   Porfirio Oar, PA  cetirizine (ZYRTEC) 10 MG tablet Take 1 tablet (10 mg total) by mouth daily. 03/16/14   Elvina Sidle, MD  cyclobenzaprine (FLEXERIL) 10 MG tablet Take 1 tablet (10 mg total) by mouth at bedtime as needed for muscle spasms. 12/11/17   Porfirio Oar, PA  etodolac (LODINE) 400 MG tablet Take one tab PO Q8hr. Take with food 09/27/18   Lattie Haw, MD  gabapentin (NEURONTIN) 100 MG capsule Take 100-300 mg by mouth at bedtime. 11/03/17   [provider]  HYDROcodone-acetaminophen (NORCO/VICODIN) 5-325 MG tablet Take one by mouth at bedtime as needed for pain.  May repeat 4 to 6 hours later if needed. 09/27/18   Lattie Haw, MD  levothyroxine (SYNTHROID, LEVOTHROID) 50 MCG tablet TAKE 1 TABLET BY MOUTH EVERY DAY IN THE MORNING BEFORE  BREAKFAST 12/11/17   Porfirio Oar, PA  meloxicam (MOBIC) 15 MG tablet Take 15 mg by mouth daily. 09/06/17   [provider]  oxyCODONE-acetaminophen (PERCOCET/ROXICET) 5-325 MG tablet Take 1 tablet by mouth every 6 (six) hours as needed for severe pain. 12/11/17   Porfirio Oar, PA  pindolol (VISKEN) 10 MG tablet Take 1 tablet (10 mg total) by mouth 2 (two) times daily. 12/11/17   Porfirio Oar, PA    Family History Family History  Problem Relation Age of Onset  . Hyperlipidemia Mother   . Graves' disease Mother   . Hypertension Father   . COPD Father   . Multiple sclerosis Sister   . Heart disease Maternal Grandfather   . Breast cancer Maternal  Grandmother     Social History Social History   Tobacco Use  . Smoking status: Never Smoker  . Smokeless tobacco: Never Used  Substance Use Topics  . Alcohol use: Yes    Alcohol/week: 0.0 standard drinks    Comment: previously drank 4 drinks/week, stopped in effort to lose weight  . Drug use: No     Allergies   Tramadol and Xanax [alprazolam]   Review of Systems Review of Systems  Constitutional: Negative for fever.  Musculoskeletal: Positive for stiffness. Negative for back pain.     Physical Exam Triage Vital Signs ED Triage Vitals  Enc Vitals Group     BP 09/27/18 1702 127/84     Pulse Rate 09/27/18 1702 72     Resp --      Temp 09/27/18 1702 98.3 F (36.8 C)     Temp Source 09/27/18 1702 Oral     SpO2 09/27/18 1702 97 %     Weight 09/27/18 1704 210 lb (95.3 kg)     Height --      Head Circumference --      Peak Flow --      Pain Score 09/27/18 1703 9     Pain Loc --      Pain Edu? --      Excl. in GC? --    No data found.  Updated Vital Signs BP 127/84 (BP Location: Right Arm)   Pulse 72   Temp 98.3 F (36.8 C) (Oral)   Wt 95.3 kg   SpO2 97%   BMI 40.01 kg/m   Visual Acuity Right Eye Distance:   Left Eye Distance:   Bilateral Distance:    Right Eye Near:   Left Eye Near:    Bilateral Near:     Physical Exam  Constitutional: She appears well-developed and well-nourished.  HENT:  Head: Normocephalic.  Eyes: Pupils are equal, round, and reactive to light.  Neck: Normal range of motion.  Cardiovascular: Normal heart sounds.  Pulmonary/Chest: Breath sounds normal.  Abdominal: There is no tenderness.  Musculoskeletal: She exhibits no edema.       Legs: There is distinct tenderness to palpation over the anterior lateral tibial compartments as noted on diagram.  No erythema or warmth.  No calf tenderness or swelling.   Left knee:  No effusion, erythema, or warmth.  Knee stable, negative drawer test.  McMurray test negative.  Patient has  pain with full flexion of left knee.  There is tenderness to palpation over the LCL.    Neurological: She is alert.  Skin: Skin is warm and dry. No rash noted.  Nursing note and vitals reviewed.    UC Treatments / Results  Labs (all labs ordered are listed, but only  abnormal results are displayed) Labs Reviewed - No data to display  EKG None  Radiology Dg Tibia/fibula Left  Result Date: 09/27/2018 CLINICAL DATA:  Acute LEFT LOWER leg pain for 6 weeks. Initial encounter. EXAM: LEFT TIBIA AND FIBULA - 2 VIEW COMPARISON:  None. FINDINGS: There is no evidence of fracture or other focal bone lesions. Soft tissues are unremarkable. Degenerative changes in the knee noted. IMPRESSION: 1. No acute bony abnormality 2. Degenerative changes within the knee. Electronically Signed   By: Harmon Pier M.D.   On: 09/27/2018 18:21   Dg Knee Complete 4 Views Left  Result Date: 09/27/2018 CLINICAL DATA:  Knee pain for 6 weeks. EXAM: LEFT KNEE - COMPLETE 4+ VIEW COMPARISON:  None. FINDINGS: Patellofemoral joint space narrowing and spurring consistent with osteoarthritis. Soft tissue calcification along the lateral collateral ligament may reflect stigmata of old trauma. No acute fracture or joint effusion. IMPRESSION: 1. Patellofemoral joint space narrowing and spurring consistent with osteoarthritis. 2. No acute osseous appearing abnormality. 3. Soft tissue calcification along the lateral collateral ligament may reflect stigmata of old remote trauma to the LCL. Electronically Signed   By: Tollie Eth M.D.   On: 09/27/2018 18:23    Procedures Procedures (including critical care time)  Medications Ordered in UC Medications - No data to display  Initial Impression / Assessment and Plan / UC Course  I have reviewed the triage vital signs and the nursing notes.  Pertinent labs & imaging results that were available during my care of the patient were reviewed by me and considered in my medical decision making  (see chart for details).    Rx for etodolac at patient's request. Rx for Lortab (#10, no refill) at bedtime. Controlled Substance Prescriptions I have consulted the [place] Controlled Substances Registry for this patient, and feel the risk/benefit ratio today is favorable for proceeding with this prescription for a controlled substance.   Followup with Dr. Rodney Langton or Dr. Clementeen Graham ([facility]) if not improving about two weeks.    Final Clinical Impressions(s) / UC Diagnoses   Final diagnoses:  Anterior shin splints  Patellofemoral dysfunction of left knee     Discharge Instructions     Apply ice pack for 20 to 30 minutes, 3 to 4 times daily  Continue until pain and swelling decrease.  Begin range of motion and stretching exercises as tolerated.    ED Prescriptions    Medication Sig Dispense Auth. Provider   etodolac (LODINE) 400 MG tablet Take one tab PO Q8hr. Take with food 30 tablet Lattie Haw, MD   HYDROcodone-acetaminophen (NORCO/VICODIN) 5-325 MG tablet Take one by mouth at bedtime as needed for pain.  May repeat 4 to 6 hours later if needed. 10 tablet Lattie Haw, MD        Lattie Haw, MD 09/30/18 [phone]

## 2018-09-27 NOTE — Discharge Instructions (Addendum)
Apply ice pack for 20 to 30 minutes, 3 to 4 times daily  Continue until pain and swelling decrease.  Begin range of motion and stretching exercises as tolerated. 

## 2018-11-16 ENCOUNTER — Ambulatory Visit: Payer: BC Managed Care – PPO | Admitting: Neurology

## 2018-11-16 ENCOUNTER — Encounter: Payer: Self-pay | Admitting: Neurology

## 2018-11-16 VITALS — BP 120/75 | HR 64 | Ht 61.75 in | Wt 214.0 lb

## 2018-11-16 DIAGNOSIS — R351 Nocturia: Secondary | ICD-10-CM

## 2018-11-16 DIAGNOSIS — Z9189 Other specified personal risk factors, not elsewhere classified: Secondary | ICD-10-CM | POA: Diagnosis not present

## 2018-11-16 DIAGNOSIS — R0683 Snoring: Secondary | ICD-10-CM

## 2018-11-16 DIAGNOSIS — G4719 Other hypersomnia: Secondary | ICD-10-CM

## 2018-11-16 DIAGNOSIS — R51 Headache: Secondary | ICD-10-CM

## 2018-11-16 DIAGNOSIS — Z820 Family history of epilepsy and other diseases of the nervous system: Secondary | ICD-10-CM

## 2018-11-16 DIAGNOSIS — R519 Headache, unspecified: Secondary | ICD-10-CM

## 2018-11-16 DIAGNOSIS — R0681 Apnea, not elsewhere classified: Secondary | ICD-10-CM | POA: Diagnosis not present

## 2018-11-16 NOTE — Patient Instructions (Signed)

## 2018-11-16 NOTE — Progress Notes (Signed)
Subjective:    Patient ID: Renee Fernandez is a 47 y.o. female.  HPI     Renee Foley, Renee Fernandez, Renee Fernandez [facility] [address] [address] [place], [place] [address]  Dear Avelino Leeds,   I saw your patient, Renee Fernandez, upon your kind request in the sleep clinic today for initial consultation of her sleep disorder, in particular, concern for underlying obstructive sleep apnea. The patient is unaccompanied today. As you know, Renee Fernandez is a 47 year old right-handed woman with an underlying medical history of thyroid disease, neck pain, asthma, arthritis, allergies, history of carpal tunnel syndrome, history of headaches and changes in her sense of smell (for which she saw Dr. Anne Hahn in our office in 2017), and obesity, who reports snoring and excessive daytime somnolence. I reviewed your office note from 12/11/2017. She has woken up with a sense of gasping for air and choking. Her husband has witnessed apneas while she is asleep. Her husband has sleep apnea and uses a CPAP machine. She would consider CPAP therapy if the need arises. Her mother has sleep apnea. She is married and lives with her husband and her 1 yo son. She works at [employer] as a Nurse, mental health, prior to that she worked for [employer]. She is a nonsmoker, drinks alcohol on average twice a week, does utilize caffeine on a regular basis, but limits herself to 2 servings, typically in the form of iced tea, unsweet, and 1 soda per day. Her Epworth sleepiness score is 15/24, fatigue score is 43 out of 63. Bedtime is around 10 and rise time around 6. In the recent past she has started having some difficulty going to sleep and staying asleep. She has significant nocturia, about 4 times per average night. She has no TV in the bedroom but does have 3 dogs that are in the bedroom often and also on her bed. She has had some recent headaches in the mornings maybe in the past few months. She does not  typically have migraines but has a family history of migraines in her mother and sister, sister also has MS. Weight has been fluctuating. She denies telltale symptoms of restless leg syndrome. She has a history of neck pain, she has been taking Flexeril as needed, she has been on gabapentin which has been helpful.   Her Past Medical History Is Significant For: Past Medical History:  Diagnosis Date  . Allergy   . Arthritis   . Asthma   . Carpal tunnel syndrome   . Hypertension   . Neck pain   . Thyroid disease     Her Past Surgical History Is Significant For: Past Surgical History:  Procedure Laterality Date  . CESAREAN SECTION  2007    Her Family History Is Significant For: Family History  Problem Relation Age of Onset  . Hyperlipidemia Mother   . Graves' disease Mother   . Hypertension Father   . COPD Father   . Multiple sclerosis Sister   . Heart disease Maternal Grandfather   . Breast cancer Maternal Grandmother     Her Social History Is Significant For: Social History   Socioeconomic History  . Marital status: Married    Spouse name: Neena Rhymes  . Number of children: 1  . Years of education: college  . Highest education level: Bachelor's degree (e.g., BA, AB, BS)  Occupational History  . Occupation: Nurse, mental health    Comment: Psychiatrist  Social Needs  . Physicist, medical  strain: Not on file  . Food insecurity:    Worry: Not on file    Inability: Not on file  . Transportation needs:    Medical: Not on file    Non-medical: Not on file  Tobacco Use  . Smoking status: Never Smoker  . Smokeless tobacco: Never Used  Substance and Sexual Activity  . Alcohol use: Yes    Alcohol/week: 0.0 standard drinks    Comment: previously drank 4 drinks/week, stopped in effort to lose weight  . Drug use: No  . Sexual activity: Yes    Partners: Male    Birth control/protection: Other-see comments    Comment: ablation  Lifestyle  . Physical activity:     Days per week: 0 days    Minutes per session: 0 min  . Stress: To some extent  Relationships  . Social connections:    Talks on phone: More than three times a week    Gets together: Once a week    Attends religious service: Never    Active member of club or organization: No    Attends meetings of clubs or organizations: Never    Relationship status: Married  Other Topics Concern  . Not on file  Social History Narrative   Lives with her husband and their son.   Family lives nearby.   Right-handed.   Caffeine: 1 soda per day    Her Allergies Are:  Allergies  Allergen Reactions  . Tramadol   . Xanax [Alprazolam]   :   Her Current Medications Are:  Outpatient Encounter Medications as of 11/16/2018  Medication Sig  . albuterol (PROVENTIL HFA;VENTOLIN HFA) 108 (90 Base) MCG/ACT inhaler Inhale 2 puffs into the lungs every 4 (four) hours as needed for wheezing.  . benazepril-hydrochlorthiazide (LOTENSIN HCT) 10-12.5 MG tablet Take 1 tablet by mouth daily.  . cetirizine (ZYRTEC) 10 MG tablet Take 1 tablet (10 mg total) by mouth daily.  . cyclobenzaprine (FLEXERIL) 10 MG tablet Take 1 tablet (10 mg total) by mouth at bedtime as needed for muscle spasms.  Marland Kitchen. etodolac (LODINE) 400 MG tablet Take one tab PO Q8hr. Take with food  . gabapentin (NEURONTIN) 100 MG capsule Take 100-300 mg by mouth at bedtime.  Marland Kitchen. HYDROcodone-acetaminophen (NORCO/VICODIN) 5-325 MG tablet Take one by mouth at bedtime as needed for pain.  May repeat 4 to 6 hours later if needed.  Marland Kitchen. levothyroxine (SYNTHROID, LEVOTHROID) 50 MCG tablet TAKE 1 TABLET BY MOUTH EVERY DAY IN THE MORNING BEFORE BREAKFAST  . oxyCODONE-acetaminophen (PERCOCET/ROXICET) 5-325 MG tablet Take 1 tablet by mouth every 6 (six) hours as needed for severe pain.  . pindolol (VISKEN) 10 MG tablet Take 1 tablet (10 mg total) by mouth 2 (two) times daily.  . [DISCONTINUED] meloxicam (MOBIC) 15 MG tablet Take 15 mg by mouth daily.   No  facility-administered encounter medications on file as of 11/16/2018.   :  Review of Systems:  Out of a complete 14 point review of systems, all are reviewed and negative with the exception of these symptoms as listed below:  Review of Systems  Neurological:       Pt presents today to discuss her sleep. Pt has never had a sleep study but does endorse snoring.  Epworth Sleepiness Scale 0= would never doze 1= slight chance of dozing 2= moderate chance of dozing 3= high chance of dozing  Sitting and reading: 3 Watching TV: 2 Sitting inactive in a public place (ex. Theater or meeting): 2 As a  passenger in a car for an hour without a break: 1 Lying down to rest in the afternoon: 2 Sitting and talking to someone: 1 Sitting quietly after lunch (no alcohol): 2 In a car, while stopped in traffic: 2 Total: 15     Objective:  Neurological Exam  Physical Exam Physical Examination:   Vitals:   11/16/18 1553  BP: 120/75  Pulse: 64   General Examination: The patient is a very pleasant 47 y.o. female in no acute distress. She appears well-developed and well-nourished and well groomed.   HEENT: Normocephalic, atraumatic, pupils are equal, round and reactive to light and accommodation. Extraocular tracking is good without limitation to gaze excursion or nystagmus noted. Normal smooth pursuit is noted. Hearing is grossly intact. Face is symmetric with normal facial animation and normal facial sensation. Speech is clear with no dysarthria noted. There is no hypophonia. There is no lip, neck/head, jaw or voice tremor. Neck shows FROM, but reports discomfort in her neck. There are no carotid bruits on auscultation. Oropharynx exam reveals: mild mouth dryness, adequate dental hygiene and moderate airway crowding, due to tonsillar size of 1-2+, larger uvula, smaller airway entry. Mallampati is class III. Tongue protrudes centrally and palate elevates symmetrically. She has a mild overbite. Neck  circumference is 17 inches.  Chest: Clear to auscultation without wheezing, rhonchi or crackles noted.  Heart: S1+S2+0, regular and normal without murmurs, rubs or gallops noted.   Abdomen: Soft, non-tender and non-distended with normal bowel sounds appreciated on auscultation.  Extremities: There is no pitting edema in the distal lower extremities bilaterally.   Skin: Warm and dry without trophic changes noted.  Musculoskeletal: exam reveals no obvious joint deformities, tenderness or joint swelling or erythema.   Neurologically:  Mental status: The patient is awake, alert and oriented in all 4 spheres. Her immediate and remote memory, attention, language skills and fund of knowledge are appropriate. There is no evidence of aphasia, agnosia, apraxia or anomia. Speech is clear with normal prosody and enunciation. Thought process is linear. Mood is normal and affect is normal.  Cranial nerves II - XII are as described above under HEENT exam. In addition: shoulder shrug is normal with equal shoulder height noted. Motor exam: Normal bulk, strength and tone is noted. There is no drift, tremor or rebound. Romberg is negative. Fine motor skills and coordination: intact with normal finger taps, normal hand movements, normal rapid alternating patting, normal foot taps and normal foot agility.  Cerebellar testing: No dysmetria or intention tremor. There is no truncal or gait ataxia.  Sensory exam: intact to light touch.  Gait, station and balance: She stands easily. No veering to one side is noted. No leaning to one side is noted. Posture is age-appropriate and stance is narrow based. Gait shows normal stride length and normal pace. No problems turning are noted. Tandem walk is unremarkable.   Assessment and Plan:  In summary, Renee Fernandez is a very pleasant 47 y.o.-year old female with an underlying medical history of thyroid disease, neck pain, asthma, arthritis, allergies, history of carpal  tunnel syndrome, history of headaches and changes in her sense of smell (for which she saw Dr. Anne Hahn in our office in 2017), and obesity, whose history and physical exam are concerning for obstructive sleep apnea (OSA). I had a long chat with the patient about my findings and the diagnosis of OSA, its prognosis and treatment options. We talked about medical treatments, surgical interventions and non-pharmacological approaches. I explained in particular  the risks and ramifications of untreated moderate to severe OSA, especially with respect to developing cardiovascular disease down the Road, including congestive heart failure, difficult to treat hypertension, cardiac arrhythmias, or stroke. Even type 2 diabetes has, in part, been linked to untreated OSA. Symptoms of untreated OSA include daytime sleepiness, memory problems, mood irritability and mood disorder such as depression and anxiety, lack of energy, as well as recurrent headaches, especially morning headaches. We talked about trying to maintain a healthy lifestyle in general, as well as the importance of weight control. I encouraged the patient to eat healthy, exercise daily and keep well hydrated, to keep a scheduled bedtime and wake time routine, to not skip any meals and eat healthy snacks in between meals. I advised the patient not to drive when feeling sleepy. I recommended the following at this time: sleep study with potential positive airway pressure titration. (We will score hypopneas at 3%).   I explained the sleep test procedure to the patient and also outlined possible surgical and non-surgical treatment options of OSA, including the use of a custom-made dental device (which would require a referral to a specialist dentist or oral surgeon), upper airway surgical options, such as pillar implants, radiofrequency surgery, tongue base surgery, and UPPP (which would involve a referral to an ENT surgeon). Rarely, jaw surgery such as mandibular  advancement may be considered.  I also explained the CPAP treatment option to the patient, who indicated that she would be willing to try CPAP if the need arises. I explained the importance of being compliant with PAP treatment, not only for insurance purposes but primarily to improve Her symptoms, and for the patient's long term health benefit, including to reduce Her cardiovascular risks. I answered all her questions today and the patient was in agreement. I plan to see her back after the sleep study is completed and encouraged her to call with any interim questions, concerns, problems or updates.   Thank you very much for allowing me to participate in the care of this nice patient. If I can be of any further assistance to you please do not hesitate to call me at [phone].  Sincerely,   Renee Foley, Renee Fernandez, Renee Fernandez

## 2019-01-31 ENCOUNTER — Telehealth: Payer: Self-pay | Admitting: Neurology

## 2019-01-31 NOTE — Telephone Encounter (Signed)
We have attempted to call the patient 2 times to schedule sleep study. Patient has been unavailable at the phone numbers we have on file and has not returned our calls. At this point we will send a letter asking pt to please contact the sleep lab to schedule their sleep study. If patient calls back we will schedule them for their sleep study. ° °

## 2020-02-19 IMAGING — DX DG KNEE COMPLETE 4+V*L*
4 series · 4 of 4 positions shown · non-contrast
Comparison: None.

CLINICAL DATA: Knee pain for 6 weeks.

EXAM:
LEFT KNEE - COMPLETE 4+ VIEW

[knee ap]
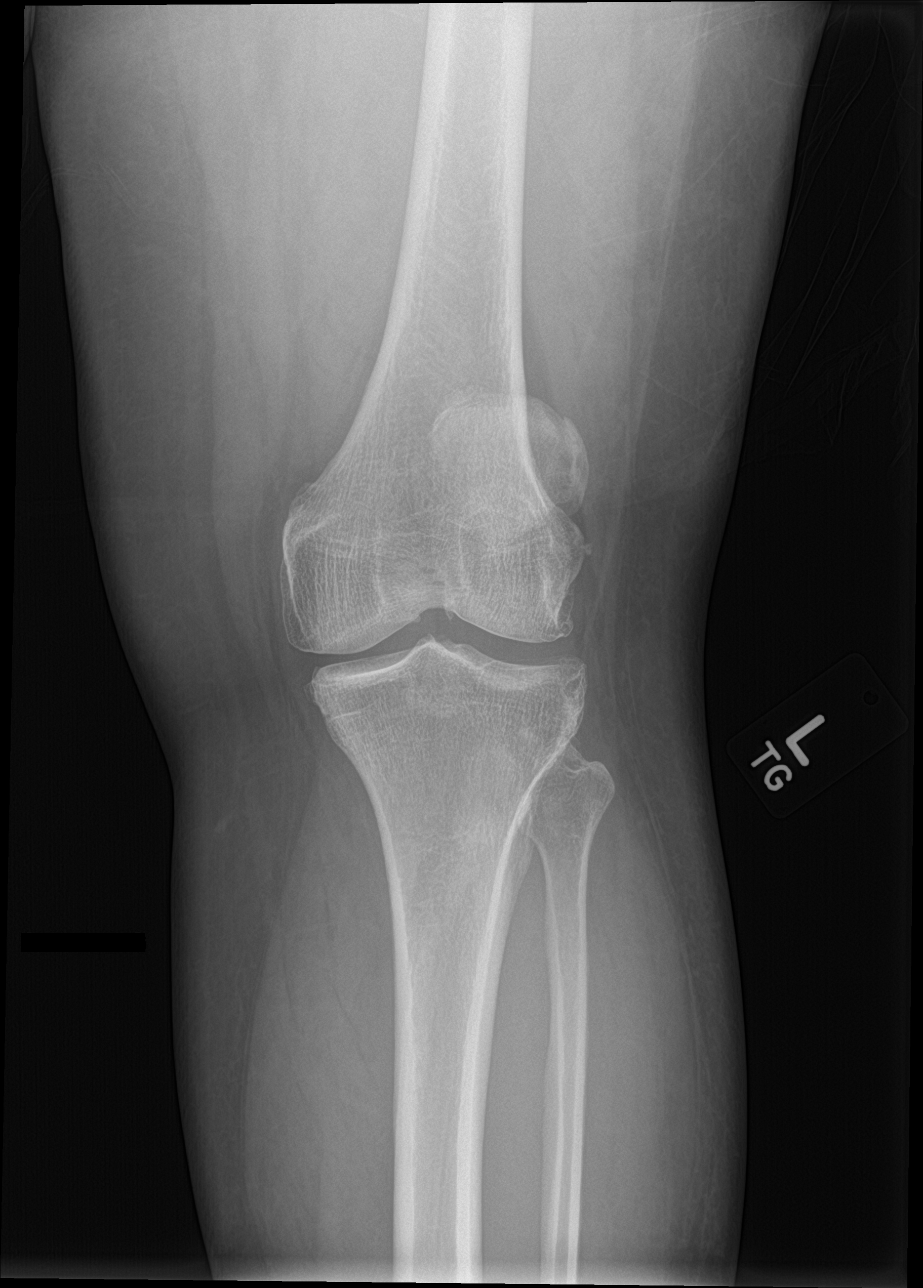

[tunnel]
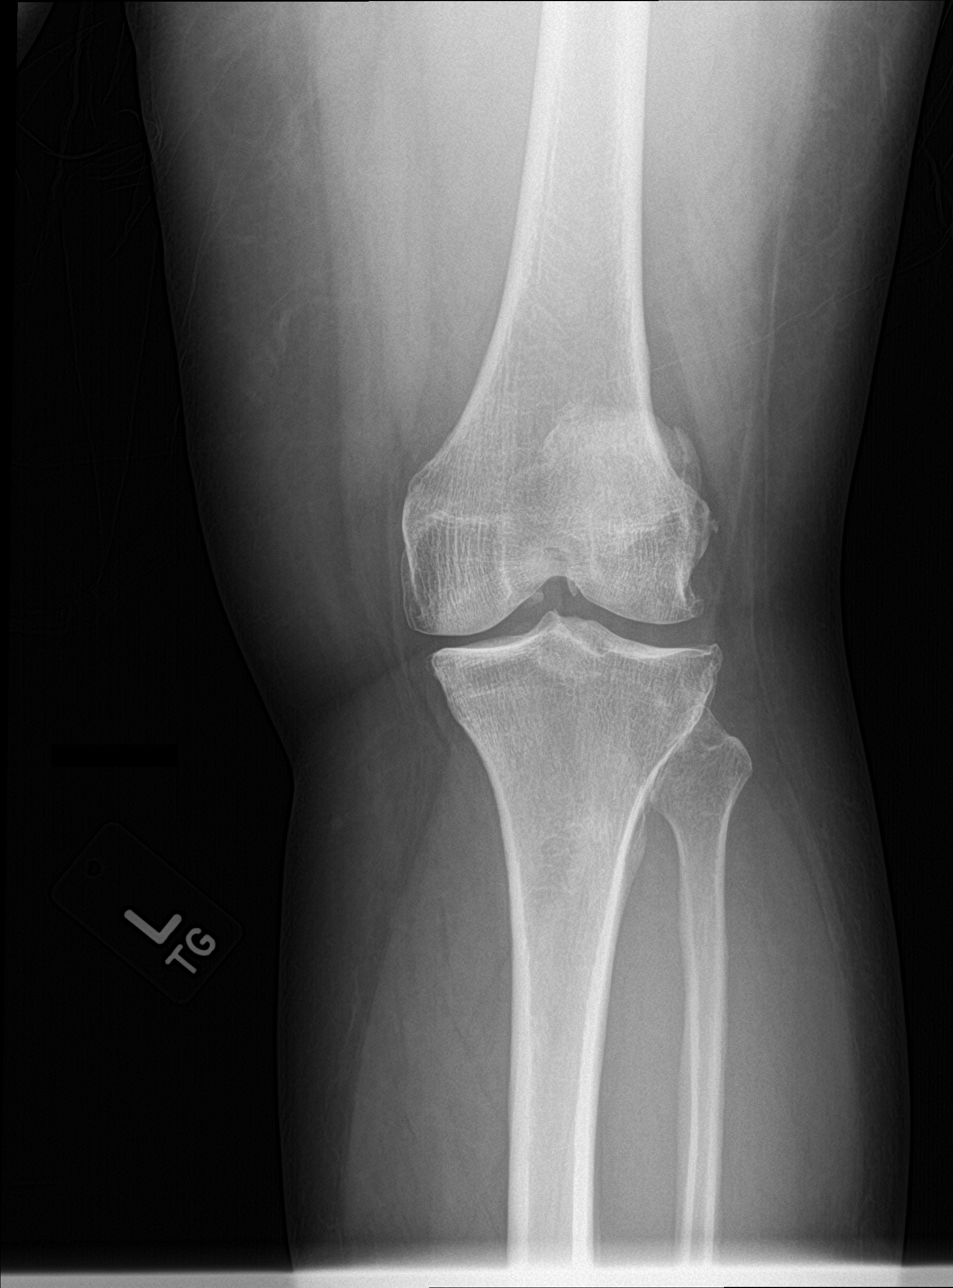

[knee lat]
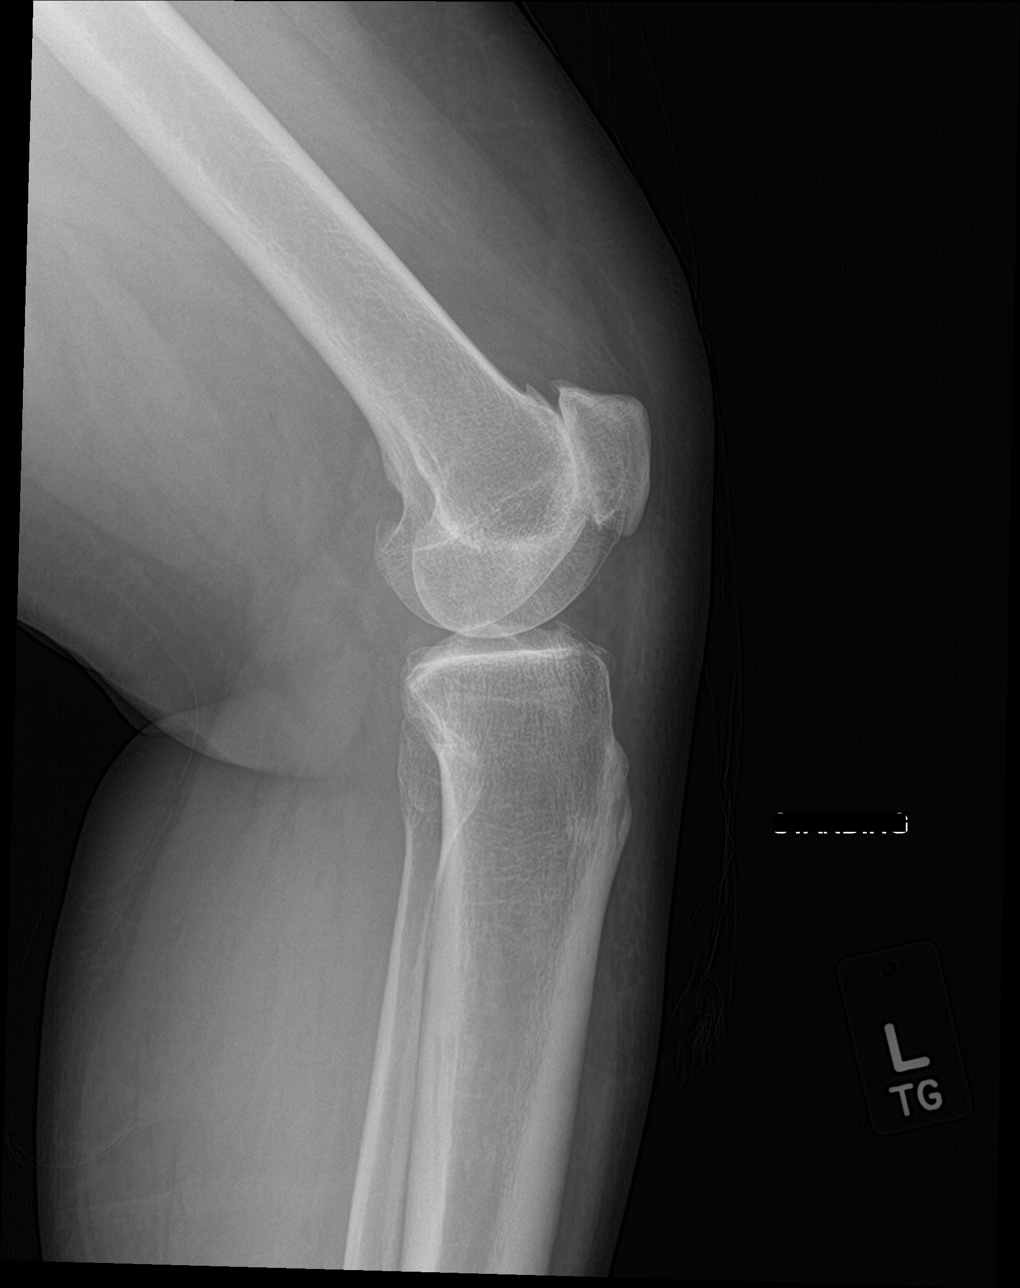

[knee sunrise]
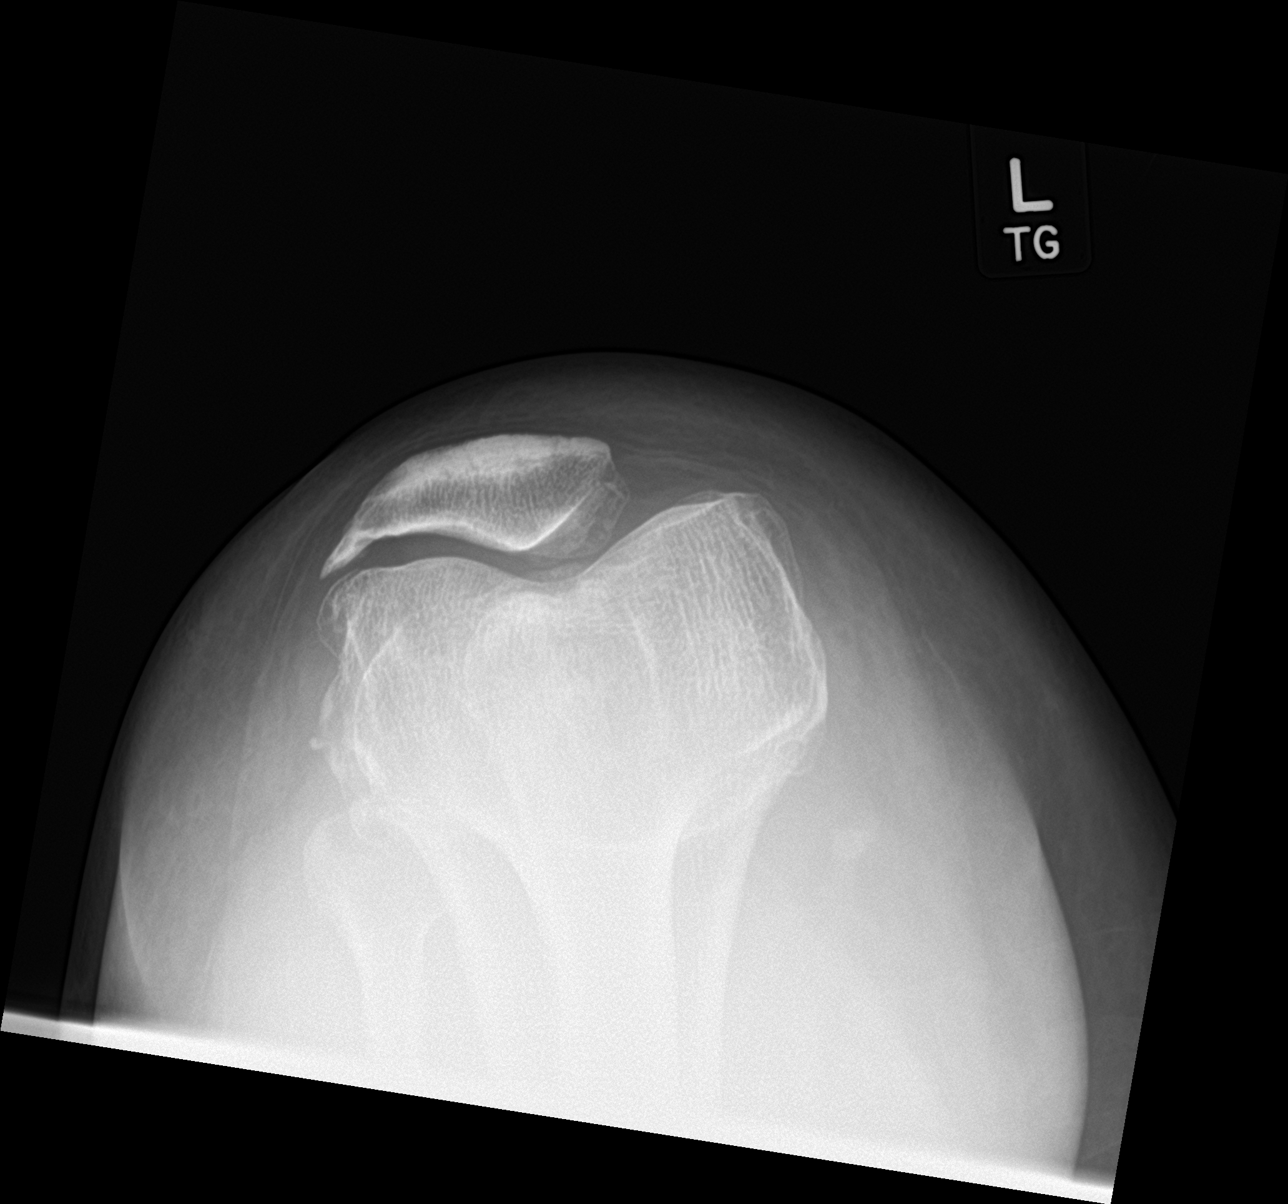

[4 of 4 positions shown; findings below may reference images not displayed]

FINDINGS: Patellofemoral joint space narrowing and spurring consistent with
osteoarthritis. Soft tissue calcification along the lateral
collateral ligament may reflect stigmata of old trauma. No acute
fracture or joint effusion.
IMPRESSION: 1. Patellofemoral joint space narrowing and spurring consistent with
osteoarthritis.
2. No acute osseous appearing abnormality.
3. Soft tissue calcification along the lateral collateral ligament
may reflect stigmata of old remote trauma to the LCL.
